# Patient Record
Sex: Female | Born: 1965 | Race: Black or African American | Hispanic: No | Marital: Single | Smoking: Never smoker
Health system: Southern US, Community
[De-identification: ages and names within clinical notes are randomized; demographics above are authoritative.]

## PROBLEM LIST (undated history)

## (undated) DIAGNOSIS — I10 Essential (primary) hypertension: Secondary | ICD-10-CM

---

## 2019-06-13 ENCOUNTER — Encounter (HOSPITAL_COMMUNITY): Payer: Self-pay | Admitting: Emergency Medicine

## 2019-06-13 ENCOUNTER — Ambulatory Visit (HOSPITAL_COMMUNITY)
Admission: EM | Admit: 2019-06-13 | Discharge: 2019-06-13 | Disposition: A | Discharge status: Home / Self Care | Payer: Self-pay | Attending: Family Medicine | Admitting: Family Medicine

## 2019-06-13 ENCOUNTER — Other Ambulatory Visit: Payer: Self-pay

## 2019-06-13 DIAGNOSIS — I10 Essential (primary) hypertension: Secondary | ICD-10-CM

## 2019-06-13 HISTORY — DX: Essential (primary) hypertension: I10

## 2019-06-13 MED ORDER — CLONIDINE HCL 0.1 MG PO TABS
0.1000 mg | ORAL_TABLET | Freq: Once | ORAL | Status: AC
  Administered 2019-06-13: 0.1 mg via ORAL

## 2019-06-13 MED ORDER — CLONIDINE HCL 0.1 MG PO TABS: 0.1000 mg | ORAL_TABLET | Freq: Once | ORAL | Status: DC

## 2019-06-13 MED ORDER — AMLODIPINE BESYLATE 5 MG PO TABS: 5.0000 mg | ORAL_TABLET | Freq: Every day | ORAL | 1 refills | Status: DC

## 2019-06-13 MED ORDER — CLONIDINE HCL 0.1 MG PO TABS
ORAL_TABLET | ORAL | Status: AC
  Filled 2019-06-13: qty 1

## 2019-06-13 MED ORDER — CLONIDINE HCL 0.1 MG PO TABS
0.1000 mg | ORAL_TABLET | Freq: Once | ORAL | Status: AC
  Administered 2019-06-13: 11:00:00 0.1 mg via ORAL

## 2019-06-13 NOTE — ED Provider Notes (Signed)
[facility]    CSN: [id number] Arrival date & time: 06/13/19  1028      History   Chief Complaint Chief Complaint  Patient presents with  . Medication Refill  . Hypertension    HPI Amanda Dunn is a 53 y.o. female.   Patient has history of hypertension.  Moved here about 3 months ago and has been out of her medications since that time.  Has not had insurance so has not seen Dr. to refill medicine.  Had formally taken amlodipine with good results.  She denies any chest pain headache dizziness or other symptoms that might suggest malignant hypertension.  HPI  Past Medical History:  Diagnosis Date  . Hypertension     There are no active problems to display for this patient.   History reviewed. No pertinent surgical history.  OB History   No obstetric history on file.      Home Medications    Prior to Admission medications   Medication Sig Start Date End Date Taking? Authorizing Provider  amLODipine (NORVASC) 5 MG tablet Take 5 mg by mouth daily.   Yes [provider]    Family History Family History  Problem Relation Age of Onset  . Hypertension Mother   . Heart failure Mother   . Hypertension Father   . Heart failure Father     Social History Social History   Tobacco Use  . Smoking status: Never Smoker  . Smokeless tobacco: Never Used  Substance Use Topics  . Alcohol use: Not Currently  . Drug use: Never     Allergies   Patient has no known allergies.   Review of Systems Review of Systems  All other systems reviewed and are negative.    Physical Exam Triage Vital Signs ED Triage Vitals [06/13/19 1107]  Enc Vitals Group     BP (!) 211/110     Pulse Rate 78     Resp 18     Temp 98.2 F (36.8 C)     Temp Source Oral     SpO2 100 %     Weight      Height      Head Circumference      Peak Flow      Pain Score 0     Pain Loc      Pain Edu?      Excl. in GC?    No data found.  Updated Vital Signs BP (!)  211/110 (BP Location: Left Arm)   Pulse 78   Temp 98.2 F (36.8 C) (Oral)   Resp 18   SpO2 100%   Visual Acuity Right Eye Distance:   Left Eye Distance:   Bilateral Distance:    Right Eye Near:   Left Eye Near:    Bilateral Near:     Physical Exam Nursing note reviewed.  Constitutional:      Appearance: Normal appearance. She is obese.  HENT:     Head: Normocephalic.  Cardiovascular:     Rate and Rhythm: Normal rate and regular rhythm.  Pulmonary:     Breath sounds: Normal breath sounds.  Neurological:     General: No focal deficit present.     Mental Status: She is oriented to person, place, and time.   Pressure after clonidine decreased to 201/90.  We will continue observation   UC Treatments / Results  Labs (all labs ordered are listed, but only abnormal results are displayed) Labs Reviewed - No data to  display  EKG   Radiology No results found.  Procedures Procedures (including critical care time)  Medications Ordered in UC Medications - No data to display  Initial Impression / Assessment and Plan / UC Course  I have reviewed the triage vital signs and the nursing notes.  Pertinent labs & imaging results that were available during my care of the patient were reviewed by me and considered in my medical decision making (see chart for details).     Hypertension, poorly controlled.  Gave patient clonidine 0.1 mg x 2 in the facility.  Last pressure down to 191/91.  Will continue amlodipine with instructions to begin at 5 mg at bedtime if after 2 to 3 days pressure has not responded increased to 5 mg twice daily and follow-up with primary care physician Final Clinical Impressions(s) / UC Diagnoses   Final diagnoses:  None   Discharge Instructions   None    ED Prescriptions    None     PDMP not reviewed this encounter.   Wardell Honour, MD 06/13/19 1245

## 2019-06-13 NOTE — Discharge Instructions (Addendum)
Begin amlodipine 5 mg at bedtime.  If after 3 days pressure has not normalized increase to 5 mg in a.m. and 5 mg at bedtime and follow-up with primary care physician as soon as you can

## 2019-06-13 NOTE — ED Triage Notes (Signed)
Pt sts recently moved here and hasnt had BP meds x 1 month; pt requesting refill until her insurance with her new employer starts next month

## 2019-08-13 ENCOUNTER — Encounter: Payer: Self-pay | Admitting: Family Medicine

## 2019-08-13 ENCOUNTER — Other Ambulatory Visit: Payer: Self-pay

## 2019-08-13 ENCOUNTER — Telehealth: Payer: Self-pay

## 2019-08-13 ENCOUNTER — Ambulatory Visit (INDEPENDENT_AMBULATORY_CARE_PROVIDER_SITE_OTHER): Payer: Self-pay | Admitting: Family Medicine

## 2019-08-13 VITALS — BP 180/60 | HR 71 | Ht 62.0 in | Wt 170.0 lb

## 2019-08-13 DIAGNOSIS — M79672 Pain in left foot: Secondary | ICD-10-CM

## 2019-08-13 DIAGNOSIS — M79671 Pain in right foot: Secondary | ICD-10-CM

## 2019-08-13 DIAGNOSIS — Z8249 Family history of ischemic heart disease and other diseases of the circulatory system: Secondary | ICD-10-CM

## 2019-08-13 DIAGNOSIS — M7750 Other enthesopathy of unspecified foot: Secondary | ICD-10-CM

## 2019-08-13 DIAGNOSIS — Z8679 Personal history of other diseases of the circulatory system: Secondary | ICD-10-CM

## 2019-08-13 DIAGNOSIS — I10 Essential (primary) hypertension: Secondary | ICD-10-CM

## 2019-08-13 MED ORDER — AMLODIPINE BESYLATE 10 MG PO TABS: 10.0000 mg | ORAL_TABLET | Freq: Every day | ORAL | 3 refills | Status: DC

## 2019-08-13 NOTE — Telephone Encounter (Signed)
Spoke with pt. Informed her of her ECHO at [facility] at 9:00. Pt understood. Salvatore Marvel, CMA

## 2019-08-13 NOTE — Patient Instructions (Addendum)
Thank you for allowing me to take part in your care today!  I will increase your amlodipine from 5 mg to 10 mg daily. Please plan to follow-up with me in 2 weeks in order to recheck your blood pressure and further discuss medications.  Today I will be doing some blood work to check your cholesterol, organ function, blood levels, and uric acid as well as thyroid levels. I will call you with any abnormal values.  We will also work on getting medical records from your previous physician.  Please plan to see me in 2 weeks!    Managing Your Hypertension Hypertension is commonly called high blood pressure. This is when the force of your blood pressing against the walls of your arteries is too strong. Arteries are blood vessels that carry blood from your heart throughout your body. Hypertension forces the heart to work harder to pump blood, and may cause the arteries to become narrow or stiff. Having untreated or uncontrolled hypertension can cause heart attack, stroke, kidney disease, and other problems. What are blood pressure readings? A blood pressure reading consists of a higher number over a lower number. Ideally, your blood pressure should be below 120/80. The first ("top") number is called the systolic pressure. It is a measure of the pressure in your arteries as your heart beats. The second ("bottom") number is called the diastolic pressure. It is a measure of the pressure in your arteries as the heart relaxes. What does my blood pressure reading mean? Blood pressure is classified into four stages. Based on your blood pressure reading, your health care provider may use the following stages to determine what type of treatment you need, if any. Systolic pressure and diastolic pressure are measured in a unit called mm Hg. Normal  Systolic pressure: below 120.  Diastolic pressure: below 80. Elevated  Systolic pressure: 120-129.  Diastolic pressure: below 80. Hypertension stage  1  Systolic pressure: 130-139.  Diastolic pressure: 80-89. Hypertension stage 2  Systolic pressure: 140 or above.  Diastolic pressure: 90 or above. What health risks are associated with hypertension? Managing your hypertension is an important responsibility. Uncontrolled hypertension can lead to:  A heart attack.  A stroke.  A weakened blood vessel (aneurysm).  Heart failure.  Kidney damage.  Eye damage.  Metabolic syndrome.  Memory and concentration problems. What changes can I make to manage my hypertension? Hypertension can be managed by making lifestyle changes and possibly by taking medicines. Your health care provider will help you make a plan to bring your blood pressure within a normal range. Eating and drinking   Eat a diet that is high in fiber and potassium, and low in salt (sodium), added sugar, and fat. An example eating plan is called the DASH (Dietary Approaches to Stop Hypertension) diet. To eat this way: ? Eat plenty of fresh fruits and vegetables. Try to fill half of your plate at each meal with fruits and vegetables. ? Eat whole grains, such as whole wheat pasta, brown rice, or whole grain bread. Fill about one quarter of your plate with whole grains. ? Eat low-fat diary products. ? Avoid fatty cuts of meat, processed or cured meats, and poultry with skin. Fill about one quarter of your plate with lean proteins such as fish, chicken without skin, beans, eggs, and tofu. ? Avoid premade and processed foods. These tend to be higher in sodium, added sugar, and fat.  Reduce your daily sodium intake. Most people with hypertension should eat less than  1,500 mg of sodium a day.  Limit alcohol intake to no more than 1 drink a day for nonpregnant women and 2 drinks a day for men. One drink equals 12 oz of beer, 5 oz of wine, or 1 oz of hard liquor. Lifestyle  Work with your health care provider to maintain a healthy body weight, or to lose weight. Ask what an  ideal weight is for you.  Get at least 30 minutes of exercise that causes your heart to beat faster (aerobic exercise) most days of the week. Activities may include walking, swimming, or biking.  Include exercise to strengthen your muscles (resistance exercise), such as weight lifting, as part of your weekly exercise routine. Try to do these types of exercises for 30 minutes at least 3 days a week.  Do not use any products that contain nicotine or tobacco, such as cigarettes and e-cigarettes. If you need help quitting, ask your health care provider.  Control any long-term (chronic) conditions you have, such as high cholesterol or diabetes. Monitoring  Monitor your blood pressure at home as told by your health care provider. Your personal target blood pressure may vary depending on your medical conditions, your age, and other factors.  Have your blood pressure checked regularly, as often as told by your health care provider. Working with your health care provider  Review all the medicines you take with your health care provider because there may be side effects or interactions.  Talk with your health care provider about your diet, exercise habits, and other lifestyle factors that may be contributing to hypertension.  Visit your health care provider regularly. Your health care provider can help you create and adjust your plan for managing hypertension. Will I need medicine to control my blood pressure? Your health care provider may prescribe medicine if lifestyle changes are not enough to get your blood pressure under control, and if:  Your systolic blood pressure is 130 or higher.  Your diastolic blood pressure is 80 or higher. Take medicines only as told by your health care provider. Follow the directions carefully. Blood pressure medicines must be taken as prescribed. The medicine does not work as well when you skip doses. Skipping doses also puts you at risk for problems. Contact a  health care provider if:  You think you are having a reaction to medicines you have taken.  You have repeated (recurrent) headaches.  You feel dizzy.  You have swelling in your ankles.  You have trouble with your vision. Get help right away if:  You develop a severe headache or confusion.  You have unusual weakness or numbness, or you feel faint.  You have severe pain in your chest or abdomen.  You vomit repeatedly.  You have trouble breathing. Summary  Hypertension is when the force of blood pumping through your arteries is too strong. If this condition is not controlled, it may put you at risk for serious complications.  Your personal target blood pressure may vary depending on your medical conditions, your age, and other factors. For most people, a normal blood pressure is less than 120/80.  Hypertension is managed by lifestyle changes, medicines, or both. Lifestyle changes include weight loss, eating a healthy, low-sodium diet, exercising more, and limiting alcohol. This information is not intended to replace advice given to you by your health care provider. Make sure you discuss any questions you have with your health care provider. Document Released: 05/21/2012 Document Revised: 12/19/2018 Document Reviewed: 07/25/2016 Elsevier Patient Education  2020 [employer]    Ankle Exercises Ask your health care provider which exercises are safe for you. Do exercises exactly as told by your health care provider and adjust them as directed. It is normal to feel mild stretching, pulling, tightness, or mild discomfort as you do these exercises. Stop right away if you feel sudden pain or your pain gets worse. Do not begin these exercises until told by your health care provider. Stretching and range-of-motion exercises These exercises warm up your muscles and joints and improve the movement and flexibility of your ankle. These exercises may also help to relieve  pain. Dorsiflexion/plantar flexion  1. Sit with your __________ knee straight or bent. Do not rest your foot on anything. 2. Flex your __________ ankle to tilt the top of your foot toward your shin. This is called dorsiflexion. 3. Hold this position for __________ seconds. 4. Point your toes downward to tilt the top of your foot away from your shin. This is called plantar flexion. 5. Hold this position for __________ seconds. Repeat __________ times. Complete this exercise __________ times a day. Ankle alphabet  1. Sit with your __________ foot supported at your lower leg. ? Do not rest your foot on anything. ? Make sure your foot has room to move freely. 2. Think of your __________ foot as a paintbrush: ? Move your foot to trace each letter of the alphabet in the air. Keep your hip and knee still while you trace the letters. Trace every letter from A to Z. ? Make the letters as large as you can without causing or increasing any discomfort. Repeat __________ times. Complete this exercise __________ times a day. Passive ankle dorsiflexion This is an exercise in which something or someone moves your ankle for you. You do not move it yourself. 1. Sit on a chair that is placed on a non-carpeted surface. 2. Place your __________ foot on the floor, directly under your __________ knee. Extend your __________ leg for support. 3. Keeping your heel down, slide your __________ foot back toward the chair until you feel a stretch at your ankle or calf. If you do not feel a stretch, slide your buttocks forward to the edge of the chair while keeping your heel down. 4. Hold this stretch for __________ seconds. Repeat __________ times. Complete this exercise __________ times a day. Strengthening exercises These exercises build strength and endurance in your ankle. Endurance is the ability to use your muscles for a long time, even after they get tired. Dorsiflexors These are muscles that lift your foot  up. 1. Secure a rubber exercise band or tube to an object, such as a table leg, that will stay still when the band is pulled. Secure the other end around your __________ foot. 2. Sit on the floor, facing the object with your __________ leg extended. The band or tube should be slightly tense when your foot is relaxed. 3. Slowly flex your __________ ankle and toes to bring your foot toward your shin. 4. Hold this position for __________ seconds. 5. Slowly return your foot to the starting position, controlling the band as you do that. Repeat __________ times. Complete this exercise __________ times a day. Plantar flexors These are muscles that push your foot down. 1. Sit on the floor with your __________ leg extended. 2. Loop a rubber exercise band or tube around the ball of your __________ foot. The ball of your foot is on the walking surface, right under your toes. The band or  tube should be slightly tense when your foot is relaxed. 3. Slowly point your toes downward, pushing them away from you. 4. Hold this position for __________ seconds. 5. Slowly release the tension in the band or tube, controlling smoothly until your foot is back in the starting position. Repeat __________ times. Complete this exercise __________ times a day. Towel curls  1. Sit in a chair on a non-carpeted surface, and put your feet on the floor. 2. Place a towel in front of your feet. If told by your health care provider, add a __________ pound weight to the end of the towel. 3. Keeping your heel on the floor, put your __________ foot on the towel. 4. Pull the towel toward you by grabbing the towel with your toes and curling them under. Keep your heel on the floor. 5. Let your toes relax. 6. Grab the towel again. Keep pulling the towel until it is completely underneath your foot. Repeat __________ times. Complete this exercise __________ times a day. Standing plantar flexion This is an exercise in which you use your  toes to lift your body's weight while standing. 1. Stand with your feet shoulder-width apart. 2. Keep your weight spread evenly over the width of your feet while you rise up on your toes. Use a wall or table to steady yourself if needed, but try not to use it for support. 3. If this exercise is too easy, try these options: ? Shift your weight toward your __________ leg until you feel challenged. ? If told by your health care provider, lift your uninjured leg off the floor. 4. Hold this position for __________ seconds. Repeat __________ times. Complete this exercise __________ times a day. Tandem walking 1. Stand with one foot directly in front of the other. 2. Slowly raise your back foot up, lifting your heel before your toes, and place it directly in front of your other foot. 3. Continue to walk in this heel-to-toe way for __________ or for as long as told by your health care provider. Have a countertop or wall nearby to use if needed to keep your balance, but try not to hold onto anything for support. Repeat __________ times. Complete this exercise __________ times a day. This information is not intended to replace advice given to you by your health care provider. Make sure you discuss any questions you have with your health care provider. Document Released: 07/11/2005 Document Revised: 05/24/2018 Document Reviewed: 05/26/2018 Elsevier Patient Education  2020 [employer].

## 2019-08-13 NOTE — Progress Notes (Signed)
Patient Name: Amanda Dunn Date of Birth: 04-Jun-1966 Date of Visit: 08/16/19 PCP: Stark Klein, MD  Chief Complaint: Pain on the back of the legs   Subjective: Amanda Dunn is a 53 y.o. with medical history significant for hypertension presenting today for pain on the back of her legs near her Achilles tendon bilaterally.   Lower leg/ankle pain Amanda Dunn states she has been experiencing pain in the area near her Achilles tendon bilaterally.  Patient reports applying ice packs and taken ibuprofen in order to assist with the pain.  She reports that wearing high heeled shoes seems to decrease the pain.  She has this pain daily and it has been occurring for years but recently worsened in the last few weeks.  Patient reports previously having x-rays that show that she had bone spurs but has not received any injections or surgical evaluation for this.  Patient request to be seen by podiatrist.  Hypertension Patient reports history of hypertension has been on amlodipine 5 mg.  Patient is a resident of [place] as of 2 years ago.  She reports having elevated blood pressure measurements up to the 790W systolic over 409 diastolic but reports feeling fine.  She was evaluated at the urgent care for this elevated blood pressure measurement that was found incidentally.  On review of system patient denies chest pain, shortness of breath, headaches, blurry vision or any history of abnormal ophthalmologic findings.  Amanda Dunn reports that she limits fried foods, does not eat eggs, walks as much as she can stand due to her leg pain, does not drink sodas, does not eat sugary high-fat foods.  She reports taking a multivitamin in addition to her hypertension medication.  She is on no other medication.  Patient also reports a past medical history of rheumatic fever for which she has been monitored for since she was a teenager.  She is no longer on any prophylactic antibiotics for the history of rheumatic  fever.  Patient denies use of tobacco.  Patient reports drinking 1-2 mixed drinks per month.  Patient denies any recreational drug use.  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 and lives alone.  She reports moving from [place] 2 and half years ago. Family history is significant for fatal MI in both parents at ages 41 and 59.   I have reviewed the patient's medical, surgical, family, and social history as appropriate.  Vitals:   08/13/19 1005  BP: (!) 180/60  Pulse: 71  SpO2: 99%    Physical Exam:   General: Alert and cooperative and appears to be in no acute distress HEENT: Neck non-tender without lymphadenopathy, masses or thyromegaly Cardio: Normal S1 and S2, no S3 or S4. Rhythm is regular. No murmurs or rubs.   Pulm: Clear to auscultation bilaterally, no crackles, wheezing, or diminished breath sounds. Normal respiratory effort Abdomen: Bowel sounds normal. Abdomen soft and non-tender.  Extremities: No peripheral edema. Warm/ well perfused.  Strong radial and pedal pulses. Swelling noted bilaterally at inferior portion of achilles tendons and superior aspect of calcaneal bones bilaterally. No erythema or point tenderness. Patient is wearing high heeled boots during appointment and walks as if she is in pain but otherwise has normal ROM at the ankle joint with flexion,extension and rotation as well as eversion and inversion.  Neuro: alert and oriented, no decreased sensation at ankle joint or in feet bilaterally   Assessment & Plan:   HTN, goal below 140/90 Blood pressure is elevated 180/60 in  office today.  -advised patient to monitor blood pressures at home.  -will follow up in 2 weeks  -increased home amlodipine to 10mg  from 5mg   -lipid panel collected, total 208, LDL 132  Patient reports both parents passed away due to MI at 60 and 69 years old  -CBC, CMP, TSH collected   History of rheumatic fever Patient reports history of rheumatic fever for which she took  antibiotics for decades for prophylaxis. Patient states that she is no longer on medication for this.  -no murmur appreciated on exam today  -scheduled for echocardiogram   Ankle bone spur, bilateraly  Patient presents with bilateral pain near ankles with history of bone spurs on x rays in the past. Patient has been taking ibuprofen and applying ice packs and wearing high heeled shoes for relief.  -referral placed for podiatry  -patient encouraged to continue conservative therapy until then  -demonstrated targeted stretches  Return to care in 2 weeks.   38, MD  Family Medicine  PGY1

## 2019-08-14 LAB — LIPID PANEL
Chol/HDL Ratio: 3.3 ratio (ref 0.0–4.4)
Cholesterol, Total: 208 mg/dL — ABNORMAL HIGH (ref 100–199)
HDL: 63 mg/dL (ref >39)
LDL Chol Calc (NIH): 132 mg/dL — ABNORMAL HIGH (ref 0–99)
Triglycerides: 72 mg/dL (ref 0–149)
VLDL Cholesterol Cal: 13 mg/dL (ref 5–40)

## 2019-08-14 LAB — CBC
Hematocrit: 36.6 % (ref 34.0–46.6)
Hemoglobin: 12.1 g/dL (ref 11.1–15.9)
MCH: 25.1 pg — ABNORMAL LOW (ref 26.6–33.0)
MCHC: 33.1 g/dL (ref 31.5–35.7)
MCV: 76 fL — ABNORMAL LOW (ref 79–97)
Platelets: 321 10*3/uL (ref 150–450)
RBC: 4.82 x10E6/uL (ref 3.77–5.28)
RDW: 13.5 % (ref 11.7–15.4)
WBC: 4.8 10*3/uL (ref 3.4–10.8)

## 2019-08-14 LAB — COMPREHENSIVE METABOLIC PANEL
ALT: 18 IU/L (ref 0–32)
AST: 20 IU/L (ref 0–40)
Albumin/Globulin Ratio: 1.1 — ABNORMAL LOW (ref 1.2–2.2)
Albumin: 4.2 g/dL (ref 3.8–4.9)
Alkaline Phosphatase: 90 IU/L (ref 39–117)
BUN/Creatinine Ratio: 20 (ref 9–23)
BUN: 15 mg/dL (ref 6–24)
Bilirubin Total: 0.3 mg/dL (ref 0.0–1.2)
CO2: 22 mmol/L (ref 20–29)
Calcium: 9.1 mg/dL (ref 8.7–10.2)
Chloride: 101 mmol/L (ref 96–106)
Creatinine, Ser: 0.74 mg/dL (ref 0.57–1.00)
GFR calc Af Amer: 105 mL/min/{1.73_m2} (ref >59)
GFR calc non Af Amer: 91 mL/min/{1.73_m2} (ref >59)
Globulin, Total: 3.7 g/dL (ref 1.5–4.5)
Glucose: 96 mg/dL (ref 65–99)
Potassium: 4.4 mmol/L (ref 3.5–5.2)
Sodium: 137 mmol/L (ref 134–144)
Total Protein: 7.9 g/dL (ref 6.0–8.5)

## 2019-08-14 LAB — TSH: TSH: 1.72 u[IU]/mL (ref 0.450–4.500)

## 2019-08-14 NOTE — Progress Notes (Signed)
Results reviewed and discussed with patient to limit amount of cheese in her diet. Patient verbalized understanding. Phone disconnected during call and unable to reach patient when called back. Will plan to discuss ASCVD risk with patient strongly consider starting Atorvastatin 10 or 20mg  at next visit given strong family history of MI.

## 2019-08-16 ENCOUNTER — Encounter: Payer: Self-pay | Admitting: Family Medicine

## 2019-08-16 DIAGNOSIS — M7750 Other enthesopathy of unspecified foot: Secondary | ICD-10-CM | POA: Insufficient documentation

## 2019-08-16 DIAGNOSIS — Z8679 Personal history of other diseases of the circulatory system: Secondary | ICD-10-CM | POA: Insufficient documentation

## 2019-08-16 DIAGNOSIS — I10 Essential (primary) hypertension: Secondary | ICD-10-CM | POA: Insufficient documentation

## 2019-08-16 NOTE — Assessment & Plan Note (Signed)
Patient presents with bilateral pain near ankles with history of bone spurs on x rays in the past. Patient has been taking ibuprofen and applying ice packs and wearing high heeled shoes for relief.  -referral placed for podiatry  -patient encouraged to continue conservative therapy until then  -demonstrated targeted stretches

## 2019-08-16 NOTE — Assessment & Plan Note (Addendum)
Blood pressure is elevated 180/60 in office today.  -advised patient to monitor blood pressures at home.  -will follow up in 2 weeks  -increased home amlodipine to 10mg  from 5mg   -lipid panel collected, total 208, LDL 132  Patient reports both parents passed away due to MI at 15 and 55 years old  -CBC, CMP, TSH collected

## 2019-08-16 NOTE — Assessment & Plan Note (Signed)
Patient reports history of rheumatic fever for which she took antibiotics for decades for prophylaxis. Patient states that she is no longer on medication for this.  -no murmur appreciated on exam today  -scheduled for echocardiogram

## 2019-08-20 ENCOUNTER — Ambulatory Visit (HOSPITAL_COMMUNITY)
Admission: RE | Admit: 2019-08-20 | Discharge: 2019-08-20 | Disposition: A | Discharge status: Home / Self Care | Payer: No Typology Code available for payment source | Source: Ambulatory Visit | Attending: Family Medicine | Admitting: Family Medicine

## 2019-08-20 ENCOUNTER — Other Ambulatory Visit: Payer: Self-pay

## 2019-08-20 DIAGNOSIS — Z8249 Family history of ischemic heart disease and other diseases of the circulatory system: Secondary | ICD-10-CM | POA: Diagnosis not present

## 2019-08-20 DIAGNOSIS — Z8679 Personal history of other diseases of the circulatory system: Secondary | ICD-10-CM

## 2019-08-20 DIAGNOSIS — I10 Essential (primary) hypertension: Secondary | ICD-10-CM

## 2019-08-20 NOTE — Progress Notes (Signed)
  Echocardiogram 2D Echocardiogram has been performed.  Amanda Dunn 08/20/2019, 9:39 AM

## 2019-08-21 ENCOUNTER — Ambulatory Visit (INDEPENDENT_AMBULATORY_CARE_PROVIDER_SITE_OTHER): Payer: No Typology Code available for payment source | Admitting: Family Medicine

## 2019-08-21 ENCOUNTER — Other Ambulatory Visit (HOSPITAL_COMMUNITY)
Admission: RE | Admit: 2019-08-21 | Discharge: 2019-08-21 | Disposition: A | Discharge status: Home / Self Care | Payer: No Typology Code available for payment source | Source: Ambulatory Visit | Attending: Family Medicine | Admitting: Family Medicine

## 2019-08-21 ENCOUNTER — Encounter: Payer: Self-pay | Admitting: Family Medicine

## 2019-08-21 VITALS — BP 140/68 | HR 85 | Ht 62.0 in | Wt 169.0 lb

## 2019-08-21 DIAGNOSIS — E785 Hyperlipidemia, unspecified: Secondary | ICD-10-CM

## 2019-08-21 DIAGNOSIS — Z124 Encounter for screening for malignant neoplasm of cervix: Secondary | ICD-10-CM | POA: Diagnosis present

## 2019-08-21 DIAGNOSIS — Z23 Encounter for immunization: Secondary | ICD-10-CM | POA: Diagnosis not present

## 2019-08-21 DIAGNOSIS — Z0000 Encounter for general adult medical examination without abnormal findings: Secondary | ICD-10-CM

## 2019-08-21 DIAGNOSIS — Z1211 Encounter for screening for malignant neoplasm of colon: Secondary | ICD-10-CM | POA: Diagnosis not present

## 2019-08-21 DIAGNOSIS — Z1231 Encounter for screening mammogram for malignant neoplasm of breast: Secondary | ICD-10-CM

## 2019-08-21 MED ORDER — ROSUVASTATIN CALCIUM 10 MG PO TABS: 10.0000 mg | ORAL_TABLET | Freq: Every day | ORAL | 3 refills | Status: DC

## 2019-08-21 NOTE — Patient Instructions (Addendum)
Please call the [facility] to Schedule your mammogram.  Address: [address], [place], [place] [address] Phone: [phone]  We have referred you to gastroenterology for your colonoscopy. The office will call you with the details.   We have given you your tetanus shot.   We started you on a cholesterol medication called rosuvastatin.     Health Maintenance, Female Adopting a healthy lifestyle and getting preventive care are important in promoting health and wellness. Ask your health care provider about:  The right schedule for you to have regular tests and exams.  Things you can do on your own to prevent diseases and keep yourself healthy. What should I know about diet, weight, and exercise? Eat a healthy diet   Eat a diet that includes plenty of vegetables, fruits, low-fat dairy products, and lean protein.  Do not eat a lot of foods that are high in solid fats, added sugars, or sodium. Maintain a healthy weight Body mass index (BMI) is used to identify weight problems. It estimates body fat based on height and weight. Your health care provider can help determine your BMI and help you achieve or maintain a healthy weight. Get regular exercise Get regular exercise. This is one of the most important things you can do for your health. Most adults should:  Exercise for at least 150 minutes each week. The exercise should increase your heart rate and make you sweat (moderate-intensity exercise).  Do strengthening exercises at least twice a week. This is in addition to the moderate-intensity exercise.  Spend less time sitting. Even light physical activity can be beneficial. Watch cholesterol and blood lipids Have your blood tested for lipids and cholesterol at 55 years of age, then have this test every 5 years. Have your cholesterol levels checked more often if:  Your lipid or cholesterol levels are high.  You are older than 55 years of age.  You are  at high risk for heart disease. What should I know about cancer screening? Depending on your health history and family history, you may need to have cancer screening at various ages. This may include screening for:  Breast cancer.  Cervical cancer.  Colorectal cancer.  Skin cancer.  Lung cancer. What should I know about heart disease, diabetes, and high blood pressure? Blood pressure and heart disease  High blood pressure causes heart disease and increases the risk of stroke. This is more likely to develop in people who have high blood pressure readings, are of African descent, or are overweight.  Have your blood pressure checked: ? Every 3-5 years if you are 26-102 years of age. ? Every year if you are 24 years old or older. Diabetes Have regular diabetes screenings. This checks your fasting blood sugar level. Have the screening done:  Once every three years after age 28 if you are at a normal weight and have a low risk for diabetes.  More often and at a younger age if you are overweight or have a high risk for diabetes. What should I know about preventing infection? Hepatitis B If you have a higher risk for hepatitis B, you should be screened for this virus. Talk with your health care provider to find out if you are at risk for hepatitis B infection. Hepatitis C Testing is recommended for:  Everyone born from [birth year] through 1967.  Anyone with known risk factors for hepatitis C. Sexually transmitted infections (STIs)  Get screened for STIs, including gonorrhea and chlamydia, if: ?  You are sexually active and are younger than 55 years of age. ? You are older than 55 years of age and your health care provider tells you that you are at risk for this type of infection. ? Your sexual activity has changed since you were last screened, and you are at increased risk for chlamydia or gonorrhea. Ask your health care provider if you are at risk.  Ask your health care provider about  whether you are at high risk for HIV. Your health care provider may recommend a prescription medicine to help prevent HIV infection. If you choose to take medicine to prevent HIV, you should first get tested for HIV. You should then be tested every 3 months for as long as you are taking the medicine. Pregnancy  If you are about to stop having your period (premenopausal) and you may become pregnant, seek counseling before you get pregnant.  Take 400 to 800 micrograms (mcg) of folic acid every day if you become pregnant.  Ask for birth control (contraception) if you want to prevent pregnancy. Osteoporosis and menopause Osteoporosis is a disease in which the bones lose minerals and strength with aging. This can result in bone fractures. If you are 44 years old or older, or if you are at risk for osteoporosis and fractures, ask your health care provider if you should:  Be screened for bone loss.  Take a calcium or vitamin D supplement to lower your risk of fractures.  Be given hormone replacement therapy (HRT) to treat symptoms of menopause. Follow these instructions at home: Lifestyle  Do not use any products that contain nicotine or tobacco, such as cigarettes, e-cigarettes, and chewing tobacco. If you need help quitting, ask your health care provider.  Do not use street drugs.  Do not share needles.  Ask your health care provider for help if you need support or information about quitting drugs. Alcohol use  Do not drink alcohol if: ? Your health care provider tells you not to drink. ? You are pregnant, may be pregnant, or are planning to become pregnant.  If you drink alcohol: ? Limit how much you use to 0-1 drink a day. ? Limit intake if you are breastfeeding.  Be aware of how much alcohol is in your drink. In the [place] one drink equals one 12 oz bottle of beer (355 mL), one 5 oz glass of wine (148 mL), or one 1 oz glass of hard liquor (44 mL). General instructions  Schedule  regular health, dental, and eye exams.  Stay current with your vaccines.  Tell your health care provider if: ? You often feel depressed. ? You have ever been abused or do not feel safe at home. Summary  Adopting a healthy lifestyle and getting preventive care are important in promoting health and wellness.  Follow your health care provider's instructions about healthy diet, exercising, and getting tested or screened for diseases.  Follow your health care provider's instructions on monitoring your cholesterol and blood pressure. This information is not intended to replace advice given to you by your health care provider. Make sure you discuss any questions you have with your health care provider. Document Released: 03/12/2011 Document Revised: 08/20/2018 Document Reviewed: 08/20/2018 Elsevier Patient Education  2020 [employer].

## 2019-08-21 NOTE — Progress Notes (Signed)
dap

## 2019-08-21 NOTE — Progress Notes (Signed)
Subjective:  Amanda Dunn is a 53 y.o. female who presents to the [facility] today with a chief complaint of annual physical.   HPI:  53 y.o. year old female presents for well woman/preventative visit and annual GYN examination.  Acute Concerns: Patient with a significant cardiac history. Mother and Father died of MI in 53s. Patient with HLD and ASCVD risk of 14%. Patient with a h/o rheumatic heart dz.  Echocardiogram done yesterday. Report shows EF 75%, Moderately increased left ventricular posterior wall thickness. NO noted valvular disease. Discussed findings with patient. Advised patient on initiating a statin. She is receptive.   Patient had recent HIV and Hep C screening. Reports negative.   Exercise: intermittent   Birth Control: BLT many years ago  Allergies: No Known Allergies  Social:  Social History   Socioeconomic History  . Marital status: Single    Spouse name: Not on file  . Number of children: Not on file  . Years of education: Not on file  . Highest education level: Not on file  Occupational History  . Not on file  Tobacco Use  . Smoking status: Never Smoker  . Smokeless tobacco: Never Used  Substance and Sexual Activity  . Alcohol use: Not Currently  . Drug use: Never  . Sexual activity: Not on file  Other Topics Concern  . Not on file  Social History Narrative  . Not on file   Social Determinants of Health   Financial Resource Strain:   . Difficulty of Paying Living Expenses: Not on file  Food Insecurity:   . Worried About Charity fundraiser in the Last Year: Not on file  . Ran Out of Food in the Last Year: Not on file  Transportation Needs:   . Lack of Transportation (Medical): Not on file  . Lack of Transportation (Non-Medical): Not on file  Physical Activity:   . Days of Exercise per Week: Not on file  . Minutes of Exercise per Session: Not on file  Stress:   . Feeling of Stress : Not on file  Social Connections:   . Frequency of  Communication with Friends and Family: Not on file  . Frequency of Social Gatherings with Friends and Family: Not on file  . Attends Religious Services: Not on file  . Active Member of Clubs or Organizations: Not on file  . Attends Archivist Meetings: Not on file  . Marital Status: Not on file    Immunization: Needs TDaP  Cancer Screening:  Pap Smear: Due today  Mammogram: Last approximately 3 years ago per patient  Colonoscopy: Never had one  Vitals:   08/21/19 0920  BP: 140/68  Pulse: 85  SpO2: 98%    Physical Exam: VITALS: Reviewed GEN: Pleasant female, NAD CARDIAC:RRR, S1 and S2 present, no murmur, no heaves/thrills RESP: CTAB, normal effort ABD: Soft, no tenderness, normal bowel sounds GU/GYN:Exam performed in the presence of a chaperone. Normal external genitalia. Cervix unremarkable.  EXT: No edema, 2+ radial and DP pulses SKIN: Warm and dry, no rash  ASSESSMENT & PLAN: 53 y.o. female presents for annual well woman/preventative exam and GYN exam. Please see problem specific assessment and plan.   H/o of Rheumatic Heart Dz. No valvular abnormalities on recent echo. Patient requesting work accommodations due to [employer]. PCP, Dr. Rosita Fire, recently wrote a letter for this, but work has specific form needed. Since PCP has been working on this issue with patient. Will place form in her box and  send electronic message to her to inform her of the needed form.   Cervical Cancer Screening. Pap Smear performed today.   Breast Cancer Screening. Ordering Mammogram today. Patient given information to schedule it when convenient.   Tetanus prevention. Tdap given today.   Records request for HIV and Hep C results  Colon cancer screening. Referral to GI for colonoscopy  HLD. ASCVD Score 14% ten year risk. Starting Crestor 10 mg today.   HTN. BP at goal. Currently on amlodipine 10 mg.   Patient follow up in 6 months for BP check.     Thomes Dinning, MD,  [facility] RESIDENT - PGY3 08/21/2019 10:35 AM

## 2019-08-24 ENCOUNTER — Other Ambulatory Visit: Payer: Self-pay | Admitting: Podiatry

## 2019-08-24 ENCOUNTER — Encounter: Payer: Self-pay | Admitting: Gastroenterology

## 2019-08-24 ENCOUNTER — Telehealth: Payer: Self-pay | Admitting: Podiatry

## 2019-08-24 ENCOUNTER — Ambulatory Visit: Payer: No Typology Code available for payment source | Admitting: Podiatry

## 2019-08-24 ENCOUNTER — Ambulatory Visit (INDEPENDENT_AMBULATORY_CARE_PROVIDER_SITE_OTHER): Payer: No Typology Code available for payment source

## 2019-08-24 ENCOUNTER — Encounter: Payer: Self-pay | Admitting: Podiatry

## 2019-08-24 ENCOUNTER — Other Ambulatory Visit: Payer: Self-pay

## 2019-08-24 VITALS — BP 144/88 | HR 84 | Temp 97.9°F | Resp 16

## 2019-08-24 DIAGNOSIS — M79671 Pain in right foot: Secondary | ICD-10-CM | POA: Diagnosis not present

## 2019-08-24 DIAGNOSIS — M7662 Achilles tendinitis, left leg: Secondary | ICD-10-CM

## 2019-08-24 DIAGNOSIS — M7661 Achilles tendinitis, right leg: Secondary | ICD-10-CM | POA: Diagnosis not present

## 2019-08-24 DIAGNOSIS — M79672 Pain in left foot: Secondary | ICD-10-CM

## 2019-08-24 MED ORDER — DICLOFENAC SODIUM 75 MG PO TBEC: 75.0000 mg | DELAYED_RELEASE_TABLET | Freq: Two times a day (BID) | ORAL | 2 refills | Status: DC

## 2019-08-24 NOTE — Progress Notes (Signed)
Subjective:   Patient ID: Amanda Dunn, female   DOB: 53 y.o.   MRN: [id number]   HPI Patient presents stating having a lot of pain in the back of the heel with both of them hurting but the left one worse recently with the right one having been more originally sore.  Patient states is been going on for around a year and patient uses pain patches ibuprofen which is given her minimal relief of symptoms.  Patient does not smoke likes to be active   Review of Systems  All other systems reviewed and are negative.       Objective:  Physical Exam Vitals and nursing note reviewed.  Constitutional:      Appearance: She is well-developed.  Pulmonary:     Effort: Pulmonary effort is normal.  Musculoskeletal:        General: Normal range of motion.  Skin:    General: Skin is warm.  Neurological:     Mental Status: She is alert.     Neurovascular status was found to be intact muscle strength found to be adequate range of motion within normal limits with moderate equinus condition noted bilateral.  Patient has discomfort posterior insertion Achilles left over right medial side with no center and mild lateral involvement     Assessment:  Acute Achilles tendinitis left over right medial side with inflammation at the insertion to the calcaneus     Plan:  Acute Achilles tendinitis bilateral with pain with H&P and x-rays reviewed today.  I discussed injection I explained risk of this and she wants to undergo injection we will do the left 1 first and I did sterile prep of the medial side carefully injected 3 mg dexamethasone 5 mg Xylocaine and applied a air fracture walker to immobilize.  Gave instructions for ice therapy stretching exercises and diclofenac to be beginning now and patient will be seen back 4 weeks or earlier if needed  X-rays indicate posterior spur with some detachment left over right

## 2019-08-24 NOTE — Telephone Encounter (Signed)
**  addition to previous phone message re: work note stating pt is safe to work in [employer] & that it is slip resistant.  Pt request phone call when request has been answered and would like it sent to her email as listed: [email or web address].

## 2019-08-24 NOTE — Patient Instructions (Signed)

## 2019-08-24 NOTE — Telephone Encounter (Signed)
yes

## 2019-08-24 NOTE — Telephone Encounter (Signed)
Pt was seen by regal today he put a boot on pt she wanted aletter to say it would be safe to wear to work if so she wanted a note to say that if possible

## 2019-08-24 NOTE — Progress Notes (Signed)
   Subjective:    Patient ID: Amanda Dunn, female    DOB: [birth year], 53 y.o.   MRN: [id number]  HPI    Review of Systems  All other systems reviewed and are negative.      Objective:   Physical Exam        Assessment & Plan:

## 2019-08-24 NOTE — Telephone Encounter (Signed)
I'm not sure if it is slip resistant. 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 If cannot wear to work should use at all other times

## 2019-08-25 NOTE — Telephone Encounter (Addendum)
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  Pt stated yes. Emailed note to pt.

## 2019-08-26 ENCOUNTER — Encounter: Payer: Self-pay | Admitting: *Deleted

## 2019-08-26 LAB — CYTOLOGY - PAP
Comment: NEGATIVE
Diagnosis: NEGATIVE
High risk HPV: NEGATIVE

## 2019-09-23 ENCOUNTER — Encounter: Payer: No Typology Code available for payment source | Admitting: Gastroenterology

## 2019-09-23 ENCOUNTER — Ambulatory Visit (INDEPENDENT_AMBULATORY_CARE_PROVIDER_SITE_OTHER): Payer: No Typology Code available for payment source | Admitting: Podiatry

## 2019-09-23 ENCOUNTER — Encounter: Payer: Self-pay | Admitting: Podiatry

## 2019-09-23 ENCOUNTER — Other Ambulatory Visit: Payer: Self-pay

## 2019-09-23 DIAGNOSIS — M7661 Achilles tendinitis, right leg: Secondary | ICD-10-CM

## 2019-09-23 DIAGNOSIS — M7662 Achilles tendinitis, left leg: Secondary | ICD-10-CM | POA: Diagnosis not present

## 2019-09-23 NOTE — Progress Notes (Signed)
Subjective:   Patient ID: Amanda Dunn, female   DOB: 54 y.o.   MRN: [id number]   HPI Patient states my left heel seems somewhat improved still sore if I am on it a lot but the boot really has helped and my right heel is really bad   ROS      Objective:  Physical Exam  Neurovascular status intact with posterior pain improving left heel with the right posterior heel being very tender medial side with patient also having equinus bilateral     Assessment:  Moderate improvement left with pain still present with exquisite discomfort right H     Plan:  P reviewed both conditions and for the right I did sterile prep injected the medial side 3 mg Dexasone Kenalog 5 mg Xylocaine and I applied night splint left and begin wearing the boot on the right with patient understanding risk.  Reappoint 4 weeks and hopefully will be out of her boot completely and just using the night splint and she will pursue aggressive ice therapy and be reevaluated

## 2019-10-15 NOTE — Progress Notes (Signed)
   CHIEF COMPLAINT / HPI:  Rotator cuff strain She reports right shoulder pain for about 1 month.  She does not remember any specific trauma.  She reports waking up one morning simply feeling a lot of discomfort behind her shoulder.  The pain is primarily a dull reproducible pain at the back of her shoulder.  She does occasionally notice shooting pain that moves down her arm.  Any motion that requires her to move her arm up is painful and she can feel herself compensating with her opposite shoulder.   PERTINENT  PMH / PSH: Noncontributory   OBJECTIVE: BP 140/82   Pulse 75   Wt 164 lb (74.4 kg)   SpO2 97%   BMI 30.00 kg/m    Shoulder Inspection: No structural or bony abnormalities Palpation: No significant tenderness to palpation of the shoulder, neck, arm ROM.  Reproducible pain with active range of motion.  Especially with abduction and internal rotation. Strength: 5/5 strength Neuro/vascular: Neurovascularly intact Special tests: Positive empty can test.  Positive Hawkins test  ASSESSMENT / PLAN:  Rotator cuff strain, right, initial encounter Likely strain of the subscapularis and supraspinatus.  Possibly some evidence of impingement as well. -Home exercises provided -Thera-Band provided -Encouraged oral NSAID use -Voltaren gel prescribed -Return to clinic in 4 weeks, okay to cancel visit if improving -Consider formal physical therapy if no improvement   Health maintenance -She is currently scheduled to have a colonoscopy and a mammogram done later this year. -She reports that she had an HIV and hepatitis C test done earlier this year due to work at [employer] health   Mirian Mo, MD [facility]

## 2019-10-16 ENCOUNTER — Other Ambulatory Visit: Payer: Self-pay

## 2019-10-16 ENCOUNTER — Ambulatory Visit (INDEPENDENT_AMBULATORY_CARE_PROVIDER_SITE_OTHER): Payer: No Typology Code available for payment source | Admitting: Family Medicine

## 2019-10-16 ENCOUNTER — Encounter: Payer: Self-pay | Admitting: Family Medicine

## 2019-10-16 DIAGNOSIS — S46011A Strain of muscle(s) and tendon(s) of the rotator cuff of right shoulder, initial encounter: Secondary | ICD-10-CM | POA: Diagnosis not present

## 2019-10-16 MED ORDER — DICLOFENAC SODIUM 1 % EX GEL: 2.0000 g | Freq: Four times a day (QID) | CUTANEOUS | 1 refills | Status: DC

## 2019-10-16 NOTE — Patient Instructions (Signed)
I think that you have a rotator cuff strain.  This will get better but it will take a little bit of time.  Please use the exercises that I provided and shoot for three sets per session and shoot for 1-2 sets per day.  The diclofenac pills that you are already taking are good medication for this rotator cuff issue.  You can try using voltaren gel. I put in an order but I'm not sure that it will be any cheaper with a prescription.  Please come back in 1 month.  If it does not seem better, we will move forward with formal physical therapy.

## 2019-10-16 NOTE — Assessment & Plan Note (Addendum)
Likely strain of the subscapularis and supraspinatus.  Possibly some evidence of impingement as well. -Home exercises provided -Thera-Band provided -Encouraged oral NSAID use -Voltaren gel prescribed -Return to clinic in 4 weeks, okay to cancel visit if improving -Consider formal physical therapy if no improvement

## 2019-10-21 ENCOUNTER — Ambulatory Visit: Payer: No Typology Code available for payment source | Admitting: Podiatry

## 2019-10-26 ENCOUNTER — Encounter: Payer: Self-pay | Admitting: Podiatry

## 2019-10-26 ENCOUNTER — Ambulatory Visit (INDEPENDENT_AMBULATORY_CARE_PROVIDER_SITE_OTHER): Payer: No Typology Code available for payment source | Admitting: Podiatry

## 2019-10-26 ENCOUNTER — Other Ambulatory Visit: Payer: Self-pay

## 2019-10-26 VITALS — Temp 96.7°F

## 2019-10-26 DIAGNOSIS — M79671 Pain in right foot: Secondary | ICD-10-CM

## 2019-10-26 DIAGNOSIS — M7662 Achilles tendinitis, left leg: Secondary | ICD-10-CM | POA: Diagnosis not present

## 2019-10-26 DIAGNOSIS — M79672 Pain in left foot: Secondary | ICD-10-CM

## 2019-10-26 DIAGNOSIS — M7661 Achilles tendinitis, right leg: Secondary | ICD-10-CM | POA: Diagnosis not present

## 2019-10-27 NOTE — Progress Notes (Signed)
Subjective:   Patient ID: Amanda Dunn, female   DOB: 54 y.o.   MRN: [id number]   HPI Patient presents stating that overall she is doing better but she still has some discomfort in the back of the heels right over left   ROS      Objective:  Physical Exam  Neurovascular status intact with patient's posterior heel still sore when pressed but improved from previous with patient walking with a better heel toe gait with boot in place currently and patient recovering slowly from the inflammatory processes occurring and continuing to take medication     Assessment:  Achilles tendinitis present with improvement but still pain     Plan:  H&P reviewed anti-inflammatory stretching exercises ice therapy and boot usage.  Patient will be seen back and was also dispensed heel lifts today and I did dispense a second night splint as that is been very helpful but she can only do one at a time and wants to be able to do them both at the same time

## 2019-11-25 ENCOUNTER — Telehealth: Payer: Self-pay | Admitting: Podiatry

## 2019-11-25 NOTE — Telephone Encounter (Signed)
Unable to inform pt that I could write a note for her to be out of work until reevaluated 12/07/2019. Emailed to pt's currently listed email.

## 2019-11-25 NOTE — Telephone Encounter (Signed)
Patient is requesting a note to be off on weekends. Has heel spur & is wearing boot at work but they are wanting her to work weekends but she feels she needs that time to stretch, soak, etc.  Pls advise.

## 2019-11-25 NOTE — Telephone Encounter (Signed)
Patient is calling back to request a medical doctor's note , heel is still swollen,is working 7 days a week and needs time off for heel to heal.

## 2019-11-26 NOTE — Telephone Encounter (Signed)
Unable to leave a message mailbox is full. 

## 2019-11-26 NOTE — Telephone Encounter (Signed)
Pt states the note does not say what she wants it to say.

## 2019-11-30 NOTE — Telephone Encounter (Signed)
Emailed 11/30/2019 letter for pt to have a 5 day work week until reevaluated 12/07/2019.

## 2019-12-07 ENCOUNTER — Encounter: Payer: Self-pay | Admitting: Podiatry

## 2019-12-07 ENCOUNTER — Other Ambulatory Visit: Payer: Self-pay

## 2019-12-07 ENCOUNTER — Ambulatory Visit: Payer: No Typology Code available for payment source | Admitting: Podiatry

## 2019-12-07 VITALS — Temp 97.1°F

## 2019-12-07 DIAGNOSIS — M7661 Achilles tendinitis, right leg: Secondary | ICD-10-CM

## 2019-12-07 DIAGNOSIS — M7662 Achilles tendinitis, left leg: Secondary | ICD-10-CM | POA: Diagnosis not present

## 2019-12-07 NOTE — Progress Notes (Signed)
Subjective:   Patient ID: Amanda Dunn, female   DOB: 54 y.o.   MRN: [id number]   HPI Patient states overall she is improved from where she was but on the outside of the right one the heel has been hurting quite a bit.  Patient states that she feels like if she can get this 1 spot that that would really help her and it is a different spot and also she has trouble working weekends due to the pain   ROS      Objective:  Physical Exam  Neurovascular status intact with posterior lateral aspect tendinitis right with a central or medial part of the tendon doing well currently with patient having mood and night splint but needs a second night splint for the right foot     Assessment:  Acute Achilles tendinitis right with inflammation fluid more on the lateral side     Plan:  H&P reviewed condition and I did discuss the lateral injection explaining the risk of doing this and everything is that indicates.  Patient is willing to accept that risk and at this point I did go ahead and I did a careful injection of the outside of the right Achilles not into the center medial with 3 mg dexamethasone 5 mg Xylocaine and advised her to wear her air fracture walker and I did dispense a new night splint that I want her to use in the evenings.  Reappoint for [place] to recheck again in 4 weeks we will stop we can work currently

## 2020-01-01 ENCOUNTER — Ambulatory Visit (INDEPENDENT_AMBULATORY_CARE_PROVIDER_SITE_OTHER): Payer: No Typology Code available for payment source | Admitting: Family Medicine

## 2020-01-01 ENCOUNTER — Other Ambulatory Visit: Payer: Self-pay

## 2020-01-01 ENCOUNTER — Encounter: Payer: Self-pay | Admitting: Family Medicine

## 2020-01-01 VITALS — BP 124/60 | HR 88 | Ht 62.0 in | Wt 156.6 lb

## 2020-01-01 DIAGNOSIS — M7581 Other shoulder lesions, right shoulder: Secondary | ICD-10-CM | POA: Insufficient documentation

## 2020-01-01 NOTE — Progress Notes (Signed)
    SUBJECTIVE:   CHIEF COMPLAINT / HPI:   Right rotator cuff tendinitis Agree with Dr. Chalmers Guest prior work-up.  Patient now has (limited by pain) passive and active restriction beyond 90 degrees to AB duction and flexion.  She has good good ability to grip and use her hand if she is not moving her shoulder but anything that requires force of the shoulder particularly with any significant range of motion causes significant pain.  Patient has been guarding the side of her arm and has been significantly restricted in range of motion for the last 2 months according to her.  She does not remember any specific moment of injury but says this is now been "months and months "   PERTINENT  PMH / PSH:   OBJECTIVE:   BP 124/60   Pulse 88   Ht 5\' 2"  (1.575 m)   Wt 156 lb 9.6 oz (71 kg)   SpO2 99%   BMI 28.64 kg/m   Patient now has (limited by pain) passive and active restriction beyond 90 degrees to AB duction and flexion.  She has good distal grip strength and no sensation deficits but has difficulty with empty can test and external rotation.  No bony deformation palpable to shoulder, no erythema or cellulitis, no visual deformation.  No crepitus felt.  ASSESSMENT/PLAN:   Right rotator cuff tendinitis Agree with Dr. prior work-up.  Patient now has (limited by pain) passive and active restriction beyond 90 degrees to AB duction and flexion.  She has good distal grip strength and no sensation deficits but has difficulty with empty can test and external rotation.  At this point I expect some combination of likely supraspinatus injury along with developing adhesive capsulitis because she has not been using her arm significantly for the last 2 months.  Discussed wall walking exercises and in place referral to sports medicine for further evaluation.  Patient declines PT at this time because she says she works too many hours and needs to speak to sports before she accepts a PT referral.     Chalmers Guest, DO [facility]

## 2020-01-01 NOTE — Patient Instructions (Addendum)
Today we talked about the fact that I think that you have a right rotator cuff tendinitis.  You said that you would prefer to go see sports medicine as opposed to to start physical therapy and I think that a more exact diagnosis this would be a good decision.  I think that you can continue to use ibuprofen over-the-counter, you can take up to 800 mg at a time up to twice per day.  You can also use Tylenol which I think would be very helpful.  You can use 500 to 1000 mg up to twice per day.  If you do not hear from the sports medicine folks about an appointment in the next week please call our office to verify that the referral had been sent across but I will put in the computer now.  Dr. Criss Rosales Rotator Cuff Tendinitis  Rotator cuff tendinitis is inflammation of the tough, cord-like bands that connect muscle to bone (tendons) in the rotator cuff. The rotator cuff includes all of the muscles and tendons that connect the arm to the shoulder. The rotator cuff holds the head of the upper arm bone (humerus) in the cup (fossa) of the shoulder blade (scapula). This condition can lead to a long-lasting (chronic) tear. The tear may be partial or complete. What are the causes? This condition is usually caused by overusing the rotator cuff. What increases the risk? This condition is more likely to develop in athletes and workers who frequently use their shoulder or reach over their heads. This can include activities such as:  Tennis.  Baseball or softball.  Swimming.  Construction work.  Painting. What are the signs or symptoms? Symptoms of this condition include:  Pain spreading (radiating) from the shoulder to the upper arm.  Swelling and tenderness in front of the shoulder.  Pain when reaching, pulling, or lifting the arm above the head.  Pain when lowering the arm from above the head.  Minor pain in the shoulder when resting.  Increased pain in the shoulder at night.  Difficulty placing  the arm behind the back. How is this diagnosed? This condition is diagnosed with a medical history and physical exam. Tests may also be done, including:  X-rays.  MRI.  Ultrasounds.  CT or MR arthrogram. During this test, a contrast material is injected and then images are taken. How is this treated? Treatment for this condition depends on the severity of the condition. In less severe cases, treatment may include:  Rest. This may be done with a sling that holds the shoulder still (immobilization). Your health care provider may also recommend avoiding activities that involve lifting your arm over your head.  Icing the shoulder.  Anti-inflammatory medicines, such as aspirin or ibuprofen. In more severe cases, treatment may include:  Physical therapy.  Steroid injections.  Surgery. Follow these instructions at home: If you have a sling:  Wear the sling as told by your health care provider. Remove it only as told by your health care provider.  Loosen the sling if your fingers tingle, become numb, or turn cold and blue.  Keep the sling clean.  If the sling is not waterproof, do not let it get wet. Remove it, if allowed, or cover it with a watertight covering when you take a bath or shower. Managing pain, stiffness, and swelling  If directed, put ice on the injured area. ? If you have a removable sling, remove it as told by your health care provider. ? Put ice in  a plastic bag. ? Place a towel between your skin and the bag. ? Leave the ice on for 20 minutes, 2-3 times a day.  Move your fingers often to avoid stiffness and to lessen swelling.  Raise (elevate) the injured area above the level of your heart while you are lying down.  Find a comfortable sleeping position or sleep on a recliner, if available. Driving  Do not drive or use heavy machinery while taking prescription pain medicine.  Ask your health care provider when it is safe to drive if you have a sling on  your arm. Activity  Rest your shoulder as told by your health care provider.  Return to your normal activities as told by your health care provider. Ask your health care provider what activities are safe for you.  Do any exercises or stretches as told by your health care provider.  If you do repetitive overhead tasks, take small breaks in between and include stretching exercises as told by your health care provider. General instructions  Do not use any products that contain nicotine or tobacco, such as cigarettes and e-cigarettes. These can delay healing. If you need help quitting, ask your health care provider.  Take over-the-counter and prescription medicines only as told by your health care provider.  Keep all follow-up visits as told by your health care provider. This is important. Contact a health care provider if:  Your pain gets worse.  You have new pain in your arm, hands, or fingers.  Your pain is not relieved with medicine or does not get better after 6 weeks of treatment.  You have cracking sensations when moving your shoulder in certain directions.  You hear a snapping sound after using your shoulder, followed by severe pain and weakness. Get help right away if:  Your arm, hand, or fingers are numb or tingling.  Your arm, hand, or fingers are swollen or painful or they turn white or blue. Summary  Rotator cuff tendinitis is inflammation of the tough, cord-like bands that connect muscle to bone (tendons) in the rotator cuff.  This condition is usually caused by overusing the rotator cuff, which includes all of the muscles and tendons that connect the arm to the shoulder.  This condition is more likely to develop in athletes and workers who frequently use their shoulder or reach over their heads.  Treatment generally includes rest, anti-inflammatory medicines, and icing. In some cases, physical therapy and steroid injections may be needed. In severe cases, surgery  may be needed. This information is not intended to replace advice given to you by your health care provider. Make sure you discuss any questions you have with your health care provider. Document Revised: 12/19/2018 Document Reviewed: 08/13/2016 Elsevier Patient Education  2020 [employer].

## 2020-01-02 NOTE — Assessment & Plan Note (Signed)
Agree with Dr. Chalmers Guest prior work-up.  Patient now has (limited by pain) passive and active restriction beyond 90 degrees to AB duction and flexion.  She has good distal grip strength and no sensation deficits but has difficulty with empty can test and external rotation.  At this point I expect some combination of likely supraspinatus injury along with developing adhesive capsulitis because she has not been using her arm significantly for the last 2 months.  Discussed wall walking exercises and in place referral to sports medicine for further evaluation.  Patient declines PT at this time because she says she works too many hours and needs to speak to sports before she accepts a PT referral.

## 2020-01-05 ENCOUNTER — Ambulatory Visit (HOSPITAL_COMMUNITY)
Admission: EM | Admit: 2020-01-05 | Discharge: 2020-01-05 | Disposition: A | Discharge status: Home / Self Care | Payer: No Typology Code available for payment source | Attending: Family Medicine | Admitting: Family Medicine

## 2020-01-05 DIAGNOSIS — Z20822 Contact with and (suspected) exposure to covid-19: Secondary | ICD-10-CM | POA: Insufficient documentation

## 2020-01-05 DIAGNOSIS — R197 Diarrhea, unspecified: Secondary | ICD-10-CM | POA: Insufficient documentation

## 2020-01-05 DIAGNOSIS — Z79899 Other long term (current) drug therapy: Secondary | ICD-10-CM | POA: Insufficient documentation

## 2020-01-05 DIAGNOSIS — R112 Nausea with vomiting, unspecified: Secondary | ICD-10-CM | POA: Diagnosis not present

## 2020-01-05 DIAGNOSIS — I10 Essential (primary) hypertension: Secondary | ICD-10-CM | POA: Diagnosis not present

## 2020-01-05 DIAGNOSIS — Z7901 Long term (current) use of anticoagulants: Secondary | ICD-10-CM | POA: Insufficient documentation

## 2020-01-05 MED ORDER — ONDANSETRON 8 MG PO TBDP: 8.0000 mg | ORAL_TABLET | Freq: Three times a day (TID) | ORAL | 0 refills | Status: DC | PRN

## 2020-01-05 MED ORDER — ONDANSETRON 4 MG PO TBDP
8.0000 mg | ORAL_TABLET | Freq: Once | ORAL | Status: AC
  Administered 2020-01-05: 13:00:00 8 mg via ORAL

## 2020-01-05 MED ORDER — ONDANSETRON 4 MG PO TBDP
ORAL_TABLET | ORAL | Status: AC
  Filled 2020-01-05: qty 2

## 2020-01-05 NOTE — ED Triage Notes (Signed)
Pt c/o vomiting and diarrhea since Sunday, approx 1 hour after eating tuna wrap.

## 2020-01-05 NOTE — Discharge Instructions (Signed)
Your nausea, vomiting, and diarrhea appear to have a viral cause and should resolve with time. COVID test pending.  For nausea: Zofran prescribed. Begin with every 8 hours, than as you are able to hold food down, take it as needed. Start with clear liquids, then move to plain foods like bananas, rice, applesauce, toast, broth, grits, oatmeal. As those food settle okay you may transition to your normal foods. Avoid spicy and greasy foods as much as possible.  For Diarrhea: This is your body's natural way of getting rid of a virus. You may try taking 1 imodium to decrease amount of stools a day, but we do not want you to stop your diarrhea.   Preventing dehydration is key! You need to replace the fluid your body is expelling. Drink plenty of fluids, may use Pedialyte or sports drinks.   Please return if you are experiencing blood in your vomit or stool or experiencing dizziness, lightheadedness, extreme fatigue, increased abdominal pain.

## 2020-01-06 LAB — SARS CORONAVIRUS 2 (TAT 6-24 HRS): SARS Coronavirus 2: NEGATIVE

## 2020-01-06 NOTE — ED Provider Notes (Signed)
[facility]    CSN: [id number] Arrival date & time: 01/05/20  1209      History   Chief Complaint Chief Complaint  Patient presents with  . Emesis  . Diarrhea    HPI Amanda Dunn is a 54 y.o. female history of hypertension presenting today for evaluation of nausea vomiting and diarrhea.  Patient states that symptoms began on Sunday, approximately 2 days ago.  Symptoms began approximately 1 hour after she ate a tuna wrap.  She has had some intermittent abdominal pain which has come and gone with her symptoms.  Has had frequent bowels.  Denies blood in stool or vomit.  Has had difficulty tolerating oral intake including liquids at times.  Denies URI symptoms.  Denies close contacts with similar symptoms.  HPI  Past Medical History:  Diagnosis Date  . Hypertension     Patient Active Problem List   Diagnosis Date Noted  . Right rotator cuff tendinitis 01/01/2020  . Rotator cuff strain, right, initial encounter 10/16/2019  . HTN, goal below 140/90 08/16/2019  . History of rheumatic fever 08/16/2019  . Ankle bone spur, bilateraly  08/16/2019    No past surgical history on file.  OB History   No obstetric history on file.      Home Medications    Prior to Admission medications   Medication Sig Start Date End Date Taking? Authorizing Provider  amLODipine (NORVASC) 10 MG tablet Take 1 tablet (10 mg total) by mouth at bedtime. 08/13/19  Yes Stark Klein, MD  ondansetron (ZOFRAN ODT) 8 MG disintegrating tablet Take 1 tablet (8 mg total) by mouth every 8 (eight) hours as needed for nausea or vomiting. 01/05/20   Nyeemah Jennette, Elesa Hacker, PA-C    Family History Family History  Problem Relation Age of Onset  . Hypertension Mother   . Heart failure Mother   . Hypertension Father   . Heart failure Father     Social History Social History   Tobacco Use  . Smoking status: Never Smoker  . Smokeless tobacco: Never Used  Substance Use Topics  . Alcohol use: Not  Currently  . Drug use: Never     Allergies   Diclofenac   Review of Systems Review of Systems  Constitutional: Negative for activity change, appetite change, chills, fatigue and fever.  HENT: Negative for congestion, ear pain, rhinorrhea, sinus pressure, sore throat and trouble swallowing.   Eyes: Negative for discharge and redness.  Respiratory: Negative for cough, chest tightness and shortness of breath.   Cardiovascular: Negative for chest pain.  Gastrointestinal: Positive for abdominal pain, diarrhea, nausea and vomiting.  Musculoskeletal: Negative for myalgias.  Skin: Negative for rash.  Neurological: Negative for dizziness, light-headedness and headaches.     Physical Exam Triage Vital Signs ED Triage Vitals  Enc Vitals Group     BP 01/05/20 1232 (!) 178/86     Pulse Rate 01/05/20 1230 77     Resp 01/05/20 1230 16     Temp 01/05/20 1230 98 F (36.7 C)     Temp src --      SpO2 01/05/20 1230 100 %     Weight --      Height --      Head Circumference --      Peak Flow --      Pain Score 01/05/20 1231 9     Pain Loc --      Pain Edu? --      Excl. in [place]? --  No data found.  Updated Vital Signs BP (!) 178/86   Pulse 77   Temp 98 F (36.7 C)   Resp 16   SpO2 100%   Visual Acuity Right Eye Distance:   Left Eye Distance:   Bilateral Distance:    Right Eye Near:   Left Eye Near:    Bilateral Near:     Physical Exam Vitals and nursing note reviewed.  Constitutional:      Appearance: She is well-developed.     Comments: No acute distress  HENT:     Head: Normocephalic and atraumatic.     Nose: Nose normal.     Mouth/Throat:     Comments: Oral mucosa pink and moist, no tonsillar enlargement or exudate. Posterior pharynx patent and nonerythematous, no uvula deviation or swelling. Normal phonation.  Eyes:     Conjunctiva/sclera: Conjunctivae normal.  Cardiovascular:     Rate and Rhythm: Normal rate.  Pulmonary:     Effort: Pulmonary effort is  normal. No respiratory distress.     Comments: Breathing comfortably at rest, CTABL, no wheezing, rales or other adventitious sounds auscultated Abdominal:     General: There is no distension.     Comments: Soft, nondistended, nontender to light and deep palpation throughout abdomen  Musculoskeletal:        General: Normal range of motion.     Cervical back: Neck supple.  Skin:    General: Skin is warm and dry.  Neurological:     Mental Status: She is alert and oriented to person, place, and time.      UC Treatments / Results  Labs (all labs ordered are listed, but only abnormal results are displayed) Labs Reviewed  SARS CORONAVIRUS 2 (TAT 6-24 HRS)    EKG   Radiology No results found.  Procedures Procedures (including critical care time)  Medications Ordered in UC Medications  ondansetron (ZOFRAN-ODT) disintegrating tablet 8 mg (8 mg Oral Given 01/05/20 1325)    Initial Impression / Assessment and Plan / UC Course  I have reviewed the triage vital signs and the nursing notes.  Pertinent labs & imaging results that were available during my care of the patient were reviewed by me and considered in my medical decision making (see chart for details).     Provided Zofran with mild improvement of nausea.  Sending home with Zofran to use around-the-clock.  Suspect symptoms most likely viral etiology.  Negative peritoneal signs, do not suspect gallbladder or appendicitis pathology at this time.  Advised close monitoring of abdominal pain.  Covid PCR pending to rule out.  Recommending pushing fluids, slowly transitioning diet.  Discussed strict return precautions. Patient verbalized understanding and is agreeable with plan.  Final Clinical Impressions(s) / UC Diagnoses   Final diagnoses:  Nausea vomiting and diarrhea     Discharge Instructions     Your nausea, vomiting, and diarrhea appear to have a viral cause and should resolve with time. COVID test pending.  For  nausea: Zofran prescribed. Begin with every 8 hours, than as you are able to hold food down, take it as needed. Start with clear liquids, then move to plain foods like bananas, rice, applesauce, toast, broth, grits, oatmeal. As those food settle okay you may transition to your normal foods. Avoid spicy and greasy foods as much as possible.  For Diarrhea: This is your body's natural way of getting rid of a virus. You may try taking 1 imodium to decrease amount of stools a day, but  we do not want you to stop your diarrhea.   Preventing dehydration is key! You need to replace the fluid your body is expelling. Drink plenty of fluids, may use Pedialyte or sports drinks.   Please return if you are experiencing blood in your vomit or stool or experiencing dizziness, lightheadedness, extreme fatigue, increased abdominal pain.    ED Prescriptions    Medication Sig Dispense Auth. Provider   ondansetron (ZOFRAN ODT) 8 MG disintegrating tablet Take 1 tablet (8 mg total) by mouth every 8 (eight) hours as needed for nausea or vomiting. 24 tablet Addasyn Mcbreen, [place] C, PA-C     PDMP not reviewed this encounter.   Kurstyn Larios, [place] C, PA-C 01/06/20 1057

## 2020-01-15 ENCOUNTER — Encounter: Payer: Self-pay | Admitting: Sports Medicine

## 2020-01-15 ENCOUNTER — Ambulatory Visit: Payer: Self-pay

## 2020-01-15 ENCOUNTER — Other Ambulatory Visit: Payer: Self-pay

## 2020-01-15 ENCOUNTER — Ambulatory Visit (INDEPENDENT_AMBULATORY_CARE_PROVIDER_SITE_OTHER): Payer: No Typology Code available for payment source | Admitting: Sports Medicine

## 2020-01-15 VITALS — BP 124/86 | Ht 62.0 in | Wt 156.0 lb

## 2020-01-15 DIAGNOSIS — M7581 Other shoulder lesions, right shoulder: Secondary | ICD-10-CM

## 2020-01-15 MED ORDER — MELOXICAM 15 MG PO TABS: 15.0000 mg | ORAL_TABLET | Freq: Every day | ORAL | 1 refills | Status: DC

## 2020-01-15 NOTE — Progress Notes (Addendum)
    SUBJECTIVE:   CHIEF COMPLAINT / HPI:   Right shoulder pain Amanda Dunn is a 54 y.o. female who presents with follows up for right shoulder pain.  Patient reports that it is hurt for 1 to 2 months.  Pain cause her to wake up at night.  Patient cannot lay on that side of the shoulder.  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  Denies any injury to the shoulder.  Patient was seen in family medicine clinic and was referred here after minimal improvement with NSAIDs and home exercises.  Patient is tried ibuprofen, "arthritis cream" for pain with minimal improvement.   PERTINENT  PMH / PSH:  Prior history of shoulder dislocation on the right as a child  OBJECTIVE:   BP 124/86   Ht 5\' 2"  (1.575 m)   Wt 156 lb (70.8 kg)   BMI 28.53 kg/m   General: NAD Right Shoulder Inspection: no gross effusion or contusion Palpation: tender to palpation over the anterior portion of shoulder ROM: limited active external rotation and abduction due to pain Strength: 5/5 strength in bicept Stability: shoulder joint stable Special tests: Speeds negative, empty can test negative, Neer's negative, scarf test negative Vascular studies: NVI   ASSESSMENT/PLAN:   Acute on chronic anterior right rotator cuff tendinoplasty Exam and ultrasound consistent with supraspinatus tendinopathy. -Mobic 7.5 mg twice daily as needed -Physical therapy -Follow-up in 2 month  , MD [facility]   I was the preceptor for this visit and available for immediate consultation [facility], DO

## 2020-01-27 NOTE — Addendum Note (Signed)
Addended by: Rutha Bouchard E on: 01/27/2020 09:29 AM   Modules accepted: Orders

## 2020-02-01 ENCOUNTER — Ambulatory Visit
Payer: No Typology Code available for payment source | Attending: Sports Medicine | Admitting: Rehabilitative and Restorative Service Providers"

## 2020-02-01 ENCOUNTER — Encounter: Payer: Self-pay | Admitting: Podiatry

## 2020-02-01 ENCOUNTER — Other Ambulatory Visit: Payer: Self-pay

## 2020-02-01 ENCOUNTER — Ambulatory Visit (INDEPENDENT_AMBULATORY_CARE_PROVIDER_SITE_OTHER): Payer: No Typology Code available for payment source | Admitting: Podiatry

## 2020-02-01 VITALS — Temp 97.2°F

## 2020-02-01 DIAGNOSIS — M79671 Pain in right foot: Secondary | ICD-10-CM | POA: Diagnosis not present

## 2020-02-01 DIAGNOSIS — M7662 Achilles tendinitis, left leg: Secondary | ICD-10-CM | POA: Diagnosis not present

## 2020-02-01 DIAGNOSIS — M7661 Achilles tendinitis, right leg: Secondary | ICD-10-CM | POA: Diagnosis not present

## 2020-02-01 DIAGNOSIS — M79672 Pain in left foot: Secondary | ICD-10-CM

## 2020-02-01 MED ORDER — TRAMADOL HCL 50 MG PO TABS: 50.0000 mg | ORAL_TABLET | Freq: Three times a day (TID) | ORAL | 2 refills | Status: DC

## 2020-02-03 NOTE — Progress Notes (Signed)
Subjective:   Patient ID: Amanda Dunn, female   DOB: 54 y.o.   MRN: [id number]   HPI Patient states she seems improved but she still gets pain in the back of her heel and she is still trying to use her boot as much as possible ice therapy and uses her weekends to rest so she is able to work during the week   ROS      Objective:  Physical Exam  Neurovascular status intact with patient's right posterior lateral heel showing continued swelling inflammation but improved from previous visit     Assessment:  Chronic Achilles tendinitis right which we are continuing to work with and seems to be gradually improving     Plan:  H&P reviewed condition and I have recommended the continuation of aggressive ice therapy anti-inflammatories oral topical medication and stretch. Patient will be seen back as indicated may require ultimately more aggressive surgery but we will continue to work with this conservatively as best as possible

## 2020-02-19 ENCOUNTER — Ambulatory Visit: Payer: No Typology Code available for payment source | Admitting: Pediatrics

## 2020-02-26 ENCOUNTER — Ambulatory Visit: Payer: No Typology Code available for payment source | Admitting: Sports Medicine

## 2020-03-02 ENCOUNTER — Ambulatory Visit: Payer: No Typology Code available for payment source | Admitting: Sports Medicine

## 2020-03-07 ENCOUNTER — Other Ambulatory Visit: Payer: Self-pay | Admitting: Occupational Medicine

## 2020-03-07 ENCOUNTER — Other Ambulatory Visit: Payer: Self-pay

## 2020-03-07 ENCOUNTER — Ambulatory Visit: Payer: Self-pay

## 2020-03-07 DIAGNOSIS — M545 Low back pain, unspecified: Secondary | ICD-10-CM

## 2020-04-04 ENCOUNTER — Ambulatory Visit: Payer: No Typology Code available for payment source | Admitting: Podiatry

## 2020-04-04 ENCOUNTER — Other Ambulatory Visit (HOSPITAL_COMMUNITY): Payer: Self-pay | Admitting: Orthopedic Surgery

## 2020-04-04 DIAGNOSIS — M545 Low back pain, unspecified: Secondary | ICD-10-CM

## 2020-04-12 ENCOUNTER — Ambulatory Visit (HOSPITAL_COMMUNITY)
Admission: RE | Admit: 2020-04-12 | Discharge: 2020-04-12 | Disposition: A | Discharge status: Home / Self Care | Payer: PRIVATE HEALTH INSURANCE | Source: Ambulatory Visit | Attending: Orthopedic Surgery | Admitting: Orthopedic Surgery

## 2020-04-12 ENCOUNTER — Other Ambulatory Visit: Payer: Self-pay

## 2020-04-12 DIAGNOSIS — M545 Low back pain, unspecified: Secondary | ICD-10-CM

## 2020-11-09 ENCOUNTER — Other Ambulatory Visit: Payer: Self-pay

## 2020-11-09 ENCOUNTER — Ambulatory Visit (INDEPENDENT_AMBULATORY_CARE_PROVIDER_SITE_OTHER)
Payer: No Typology Code available for payment source | Admitting: Student in an Organized Health Care Education/Training Program

## 2020-11-09 VITALS — BP 190/92 | HR 81 | Wt 165.2 lb

## 2020-11-09 DIAGNOSIS — G5702 Lesion of sciatic nerve, left lower limb: Secondary | ICD-10-CM

## 2020-11-09 DIAGNOSIS — I10 Essential (primary) hypertension: Secondary | ICD-10-CM

## 2020-11-09 MED ORDER — AMLODIPINE BESYLATE 10 MG PO TABS: 10.0000 mg | ORAL_TABLET | Freq: Every day | ORAL | 0 refills | Status: DC

## 2020-11-09 MED ORDER — DICLOFENAC SODIUM 1 % EX GEL: 2.0000 g | Freq: Four times a day (QID) | CUTANEOUS | 0 refills | Status: DC

## 2020-11-09 MED ORDER — GABAPENTIN 600 MG PO TABS: 600.0000 mg | ORAL_TABLET | Freq: Every day | ORAL | 0 refills | Status: DC

## 2020-11-09 NOTE — Progress Notes (Signed)
   SUBJECTIVE:   CHIEF COMPLAINT / HPI: back and leg pain  HTN- No symptoms Took Amlodipine 10mg  at 1015 (right before appointment time) with coffee and running late and in pain No cuff at home Endorses compliance  Back pain-  Chronic back pain but this is different and has been worsening over the past 2 weeks.  Denies accident or injury to the area.  Believes that it may be related to changes in her work habits.  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  About 8 months ago she got a steroid injection in her back which made the pain a lot better. It is present on her left side only.  She takes 800 mg ibuprofen 1-2 times per day which helps significantly but when it wears off she is in really bad pain.  It does radiate down her leg. Denies weakness, numbness tingling No positions better or worse Hurts all the time interfering with sleep Nagging uncomfortable pain No rashes.   OBJECTIVE:   BP (!) 190/92   Pulse 81   Wt 165 lb 3.2 oz (74.9 kg)   SpO2 99%   BMI 30.22 kg/m   BP rechecked by provider ~170/90 Physical Exam Vitals and nursing note reviewed.  Constitutional:      General: She is not in acute distress.    Appearance: She is normal weight.  HENT:     Head: Normocephalic.  Cardiovascular:     Rate and Rhythm: Normal rate and regular rhythm.     Heart sounds: Normal heart sounds. No murmur heard.   Pulmonary:     Effort: Pulmonary effort is normal.  Musculoskeletal:     Lumbar back: Negative right straight leg raise test and negative left straight leg raise test.     Right hip: No bony tenderness. Normal range of motion. Normal strength.     Left hip: No bony tenderness. Normal range of motion. Normal strength.     Right lower leg: No swelling.     Left lower leg: No swelling.     Comments: Positive tenderness over left sciatic nerve area. Worse with resisted flexion and extension of hip.  Neurological:     Mental Status: She is  alert.    ASSESSMENT/PLAN:   HTN, goal below 140/90 Elevated to 190/92 today but lowered on repeat evaluation by provider to 170/90.  Patient endorses just taking her blood pressure medicine right before the appointment and is in significant pain and was rushing. Patient needs refill of her amlodipine which I sent in today. Recommended patient follow-up with her PCP in 2 weeks to reevaluate Patient is asymptomatic today  Piriformis syndrome Tenderness on exam and history significant for inflammation of the sciatic nerve. Denies any red flag symptoms or traumatic injury. -Gabapentin -Voltaren gel -Heat or ice -Referred for physical therapy -Return in 2 weeks if not improving     , DO [facility]

## 2020-11-09 NOTE — Patient Instructions (Signed)
It was a pleasure to see you today!  To summarize our discussion for this visit:  For your leg pain  Gabapentin  voltaren gel  Physical therapy  Heat or cold to see what helps you best  Follow up with your primary doctor in 2 weeks  Blood pressure  Refilled amlodipine.  Some additional health maintenance measures we should update are: Health Maintenance Due  Topic Date Due  . Hepatitis C Screening  Never done  . HIV Screening  Never done  . COLONOSCOPY (Pts 45-55yrs Insurance coverage will need to be confirmed)  Never done  . MAMMOGRAM  Never done  . INFLUENZA VACCINE  04/10/2020  .   Please return to our clinic to see PCP in 2 weeks for blood pressure.  Call the clinic at [phone] if your symptoms worsen or you have any concerns.   Thank you for allowing me to take part in your care,  Dr. Jamelle Rushing  Piriformis Syndrome  Piriformis syndrome is a condition that can cause pain and numbness in your buttocks and down the back of your leg. Piriformis syndrome happens when the small muscle that connects the base of your spine to your hip (piriformis muscle) presses on the nerve that runs down the back of your leg (sciatic nerve). The piriformis muscle helps your hip rotate and helps to bring your leg back and out. It also helps shift your weight to keep you stable while you are walking. The sciatic nerve runs under or through the piriformis muscle. Damage to the piriformis muscle can cause spasms that put pressure on the nerve below. This causes pain and discomfort while sitting and moving. The pain may feel as if it begins in the buttock and spreads (radiates) down your hip and thigh. What are the causes? This condition is caused by pressure on the sciatic nerve from the piriformis muscle. The piriformis muscle can get irritated with overuse, especially if other hip muscles are weak and the piriformis muscle has to do extra work. Piriformis syndrome can also occur  after an injury, like a fall onto your buttocks. What increases the risk? You are more likely to develop this condition if you:  Are a woman.  Sit for long periods of time.  Are a cyclist.  Have weak buttocks muscles (gluteal muscles). What are the signs or symptoms? Symptoms of this condition include:  Pain, tingling, or numbness that starts in the buttock and runs down the back of your leg (sciatica).  Pain in the groin or thigh area. Your symptoms may get worse:  The longer you sit.  When you walk, run, or climb stairs.  When straining to have a bowel movement. How is this diagnosed? This condition is diagnosed based on your symptoms, medical history, and physical exam.  During the exam, your health care provider may: ? Move your leg into different positions to check for pain. ? Press on the muscles of your hip and buttock to see if that increases your symptoms.  You may also have tests, including: ? Imaging tests such as X-rays, MRI, or ultrasound. ? Electromyogram (EMG). This test measures electrical signals sent by your nerves into the muscles. ? Nerve conduction study. This test measures how well electrical signals pass through your nerves. How is this treated? This condition may be treated by:  Stopping all activities that cause pain or make your condition worse.  Applying ice or using heat therapy.  Taking medicines to reduce pain and swelling.  Taking a muscle relaxer (muscle relaxant) to stop muscle spasms.  Doing range-of-motion and strengthening exercises (physical therapy) as told by your health care provider.  Massaging the area.  Having acupuncture.  Getting an injection of medicine in the piriformis muscle. Your health care provider will choose the medicine based on your condition. He or she may inject: ? An anti-inflammatory medicine (steroid) to reduce swelling. ? A numbing medicine (local anesthetic) to block the pain. ? Botulinum toxin. The  toxin blocks nerve impulses to specific muscles to reduce muscle tension. In rare cases, you may need surgery to cut the muscle and release pressure on the nerve if other treatments do not work. Follow these instructions at home: Activity  Do not sit for long periods. Get up and walk around every 20 minutes or as often as told by your health care provider. ? When driving long distances, make sure to take frequent stops to get up and stretch.  Use a cushion when you sit on hard surfaces.  Do exercises as told by your health care provider.  Return to your normal activities as told by your health care provider. Ask your health care provider what activities are safe for you. Managing pain, stiffness, and swelling  If directed, apply heat to the affected area as often as told by your health care provider. Use the heat source that your health care provider recommends, such as a moist heat pack or a heating pad. ? Place a towel between your skin and the heat source. ? Leave the heat on for 20-30 minutes. ? Remove the heat if your skin turns bright red. This is especially important if you are unable to feel pain, heat, or cold. You may have a greater risk of getting burned.  If directed, put ice on the injured area. ? Put ice in a plastic bag. ? Place a towel between your skin and the bag. ? Leave the ice on for 20 minutes, 2-3 times a day.      General instructions  Take over-the-counter and prescription medicines only as told by your health care provider.  Ask your health care provider if the medicine prescribed to you requires you to avoid driving or using heavy machinery.  You may need to take actions to prevent or treat constipation, such as: ? Drink enough fluid to keep your urine pale yellow. ? Take over-the-counter or prescription medicines. ? Eat foods that are high in fiber, such as beans, whole grains, and fresh fruits and vegetables. ? Limit foods that are high in fat and  processed sugars, such as fried or sweet foods.  Keep all follow-up visits as told by your health care provider. This is important. How is this prevented?  Do not sit for longer than 20 minutes at a time. When you sit, choose padded surfaces.  Warm up and stretch before being active.  Cool down and stretch after being active.  Give your body time to rest between periods of activity.  Make sure to use equipment that fits you.  Maintain physical fitness, including: ? Strength. ? Flexibility. Contact a health care provider if:  Your pain and stiffness continue or get worse.  Your leg or hip becomes weak.  You have changes in your bowel function or bladder function. Summary  Piriformis syndrome is a condition that can cause pain, tingling, and numbness in your buttocks and down the back of your leg.  You may try applying heat or ice to relieve the pain.  Do not sit for long periods. Get up and walk around every 20 minutes or as often as told by your health care provider. This information is not intended to replace advice given to you by your health care provider. Make sure you discuss any questions you have with your health care provider. Document Revised: 12/18/2018 Document Reviewed: 04/23/2018 Elsevier Patient Education  2021 [employer].

## 2020-11-10 DIAGNOSIS — G5700 Lesion of sciatic nerve, unspecified lower limb: Secondary | ICD-10-CM | POA: Insufficient documentation

## 2020-11-10 NOTE — Assessment & Plan Note (Signed)
Tenderness on exam and history significant for inflammation of the sciatic nerve. Denies any red flag symptoms or traumatic injury. -Gabapentin -Voltaren gel -Heat or ice -Referred for physical therapy -Return in 2 weeks if not improving

## 2020-11-10 NOTE — Assessment & Plan Note (Signed)
Elevated to 190/92 today but lowered on repeat evaluation by provider to 170/90.  Patient endorses just taking her blood pressure medicine right before the appointment and is in significant pain and was rushing. Patient needs refill of her amlodipine which I sent in today. Recommended patient follow-up with her PCP in 2 weeks to reevaluate Patient is asymptomatic today

## 2020-11-24 ENCOUNTER — Ambulatory Visit: Payer: No Typology Code available for payment source | Admitting: Family Medicine

## 2020-11-24 NOTE — Progress Notes (Deleted)
    SUBJECTIVE:   CHIEF COMPLAINT / HPI: f/u HTN   HTN  Patient noted to be hyertensive two weeks ago during visit for back pain. Her antihypertensive regimen includes amlodipine 10mg  daily. She reports adherence to this regimen.   PERTINENT  PMH / PSH:  HTN  Piriformis syndrome   OBJECTIVE:   There were no vitals taken for this visit.  General: female appearing stated age in no acute distress HEENT: MMM, no oral lesions noted,Neck non-tender without lymphadenopathy, masses or thyromegaly*** Cardio: Normal S1 and S2, no S3 or S4. Rhythm is regular***. No murmurs or rubs.  Bilateral radial pulses palpable Pulm: Clear to auscultation bilaterally, no crackles, wheezing, or diminished breath sounds. Normal respiratory effort, stable on *** Abdomen: Bowel sounds normal. Abdomen soft and non-tender. *** Extremities: No peripheral edema. Warm/ well perfused. *** Neuro: pt alert and oriented x4    ASSESSMENT/PLAN:   No problem-specific Assessment & Plan notes found for this encounter.     , MD [facility]   {    This will disappear when note is signed, click to select method of visit    :1}

## 2021-01-24 ENCOUNTER — Other Ambulatory Visit: Payer: Self-pay | Admitting: Student in an Organized Health Care Education/Training Program

## 2021-03-02 ENCOUNTER — Other Ambulatory Visit: Payer: Self-pay

## 2021-03-02 ENCOUNTER — Ambulatory Visit (INDEPENDENT_AMBULATORY_CARE_PROVIDER_SITE_OTHER): Payer: Self-pay | Admitting: Family Medicine

## 2021-03-02 VITALS — BP 144/67 | HR 101 | Ht 62.0 in | Wt 164.5 lb

## 2021-03-02 DIAGNOSIS — M5416 Radiculopathy, lumbar region: Secondary | ICD-10-CM

## 2021-03-02 MED ORDER — CYCLOBENZAPRINE HCL 10 MG PO TABS: 10.0000 mg | ORAL_TABLET | Freq: Every day | ORAL | 0 refills | Status: DC

## 2021-03-02 MED ORDER — PREDNISONE 10 MG (21) PO TBPK: ORAL_TABLET | ORAL | 0 refills | Status: DC

## 2021-03-02 NOTE — Progress Notes (Signed)
SUBJECTIVE:   CHIEF COMPLAINT / HPI: sciatic nerve pain  55 yo woman presents with 3 months of sciatic nerve pain. She reports that she has had low back pain and muscle spasms a year ago, but this is a different type of pain. The pain is located in her left buttock and she has aching pain and tingling/numbness that radiates down to her knee. She denies saddle anesthesia and incontinence. She was seen at [facility] in March for similar symptoms and tried gabapentin 600 mg qhs which made her groggy, but it did not relieve the pain and did not help her sleep. Her pain is present if she maintains any position for too long, but is most bothersome at night and she struggles to get sleep and find a comfortable position. She has been using ibuprofen [phone] mg q8h, voltaren gel, tylenol occasionally, and home exercises without relief. She had a lumbar MRI in August 2021 that showed left sided facet arthrosis at L4-L5. She has not done PT yet, but is interested in it when her pain is better controlled.  PERTINENT  PMH / PSH: left-sided lumbar radiculopathy  OBJECTIVE:   BP (!) 144/67   Pulse (!) 101   Ht 5\' 2"  (1.575 m)   Wt 164 lb 8 oz (74.6 kg)   SpO2 98%   BMI 30.09 kg/m   Nursing note and vitals reviewed GEN: age-appropriate AAW, resting uncomfortably in chair, NAD, mildly obese, alert and at baseline Lumbar spine: - Inspection: no gross deformity or asymmetry, swelling or ecchymosis. No skin changes - Palpation: No TTP over the spinous processes, paraspinal muscles, or SI joints b/l, TTP in mid left glute - ROM: limited active ROM of the lumbar spine in flexion and extension with pain - Strength: 5/5 strength of lower extremity in L4-S1 nerve root distributions b/l - Neuro: sensation intact in the L4-S1 nerve root distribution b/l, 2+ L4 and S1 reflexes - Straight Leg Raise test: positive bilaterally Ext: no edema Psych: Pleasant and appropriate   ASSESSMENT/PLAN:   Chronic left-sided  lumbar radiculopathy Patient with left radiculopathy symptoms present since March, but worsening despite conservative management. While pain is present in the piriformis distribution, her symptoms are most consistent with sciatica, and with MRI history I suspect this is radiculopathy from lumbar spine rather than piriformis syndrome. No red flag symptoms present. She has tried OTC NSAIDs, topical voltaren gel, gabapentin, home exercises without improvement. We discussed the nature of this pain and that data behind steroids is limited, but it may be beneficial. Patient wishes to try steroids to see if any improvement, will do 6 day prednisone dose pack. She reports that flexeril helped her in the past with sleeping with back pain; for optimum functioning will trial flexeril qhs. Discussed that she should not use NSAIDs and steroids together, only tylenol while doing the 6 days of steroids, patient able to do teachback appropriately. She can start mobic after the steroid dose pack. Patient has no hx of DM. Ideally she will be more comfortable after this approach and can begin PT once pain is better controlled. Follow up in 2 weeks, return precautions discussed. May try duloxetine if no improvement at that time, may consider updated imaging if no improvement with PT. - prednisone dose pack 10 mg (start 6 pills on day 1 and decrease by one pill daily until complete) - flexeril 10 mg qhs - referral to ambulatory PT placed - mobiv 15 mg daily to start only once steroids are  complete     Shirlean Mylar, MD [facility]

## 2021-03-02 NOTE — Assessment & Plan Note (Addendum)
Patient with left radiculopathy symptoms present since March, but worsening despite conservative management. While pain is present in the piriformis distribution, her symptoms are most consistent with sciatica, and with MRI history I suspect this is radiculopathy from lumbar spine rather than piriformis syndrome. No red flag symptoms present. She has tried OTC NSAIDs, topical voltaren gel, gabapentin, home exercises without improvement. We discussed the nature of this pain and that data behind steroids is limited, but it may be beneficial. Patient wishes to try steroids to see if any improvement, will do 6 day prednisone dose pack. She reports that flexeril helped her in the past with sleeping with back pain; for optimum functioning will trial flexeril qhs. Discussed that she should not use NSAIDs and steroids together, only tylenol while doing the 6 days of steroids, patient able to do teachback appropriately. She can start mobic after the steroid dose pack. Patient has no hx of DM. Ideally she will be more comfortable after this approach and can begin PT once pain is better controlled. Follow up in 2 weeks, return precautions discussed. May try duloxetine if no improvement at that time, may consider updated imaging if no improvement with PT. - prednisone dose pack 10 mg (start 6 pills on day 1 and decrease by one pill daily until complete) - flexeril 10 mg qhs - referral to ambulatory PT placed - mobiv 15 mg daily to start only once steroids are complete

## 2021-03-02 NOTE — Patient Instructions (Signed)
It was a pleasure to see you today!  For your sciatic nerve pain: Please start the steroid dose pack for 6 days: 1st day take 6 pills, 2nd day take 5 pills, 3rd day take 4 pills, 4th day take 3 pills, 5th day take 2 pills, 6th day take 1 pill and STOP. Take flexeril 10 mg at night to help sleep.  Continue heat/ice, stretching, voltaren gel as needed  Start taking mobic 15 mg once daily only AFTER the steroid pack has finished. DO not take this at the same time as the steroid. While on the steroid, take tylenol for pain. Follow up on 03/22/21 at 1:50 PM to check in on your pain. I have placed a referral for physical therapy. You should receive a phone call in 1-2 weeks to schedule this appointment. If for any reason you do not receive a phone call or need more help scheduling this appointment, please call our office at [phone]. You can start the PT when you are feeling better.  Be Well,  Dr. Leary Roca

## 2021-03-22 ENCOUNTER — Ambulatory Visit: Payer: Self-pay | Admitting: Family Medicine

## 2021-05-11 NOTE — Patient Instructions (Addendum)
It was wonderful to see you today.  Please bring ALL of your medications with you to every visit.   Today we talked about:  -I am so sorry I was unable to schedule you an appointment today with Sports medicine. Their number is [phone]. Please call first thing Tuesday morning for an appointment. I have sent an urgent referral. -In the interim, I have sent a prescription for Mobic that you can use once daily.  -If this does not improve, we may need to get more imaging.   Thank you for choosing [facility].   Please call [phone] with any questions about today's appointment.  Please be sure to schedule follow up at the front  desk before you leave today.   Sabino Dick, DO PGY-2 Family Medicine

## 2021-05-11 NOTE — Progress Notes (Signed)
    SUBJECTIVE:   CHIEF COMPLAINT / HPI:   F/u Sciatic Nerve Pain Patient presents today for follow-up on her chronic left-sided lumbar radiculopathy.  She has had a couple visits in our clinic for this.  Patient has tried OTC NSAIDs, topical Voltaren gel, gabapentin, prednisone home exercises, all without improvement.  She was last seen in June and shared decision making done to start a steroid Dosepak.  Was also prescribed Flexeril to use nightly.  Advised to start Mobic after completeting steriods, though she did not do this.  Presents today requesting a cortisone shot given that the pain is unbearable.  It constantly wakes her up at night.  She has trouble with certain movements.  States that when she had imaging done of her back previously she did not have sciatic pain.  She reports having the imaging done slowly due to a work-related injury.   PERTINENT  PMH / PSH:  Past Medical History:  Diagnosis Date   Hypertension     OBJECTIVE:   Ht 5\' 2"  (1.575 m)   Wt 173 lb 3.2 oz (78.6 kg)   BMI 31.68 kg/m   BP: 139/82  General: NAD, able to participate in exam, mild discomfort with exam Respiratory: Breathing comfortably on room air, no respiratory distress Extremities: Point tenderness below left gluteus medius with radiation of pain down posterior left leg.  Full active range of motion with back flexion, extension, sidebending, and rotation bilaterally. Skin: warm and dry, no rashes noted  ASSESSMENT/PLAN:   Sciatica, left side History and physical consistent with left-sided sciatic pain.  Patient has point tenderness between gluteus medius and piriformis muscle, consistent with sciatic nerve.  The pain radiates down her leg and into her foot, also consistent with sciatica.  Unfortunately, patient has trialed numerous conservative treatments including over-the-counter anti-inflammatories, prednisone, gabapentin, topical Voltaren gel, and home exercises.  Appears that the pain is  worsening and not responding to these treatments anymore.  At this point, I feel that a cortisone shot may benefit the patient.  Recommended that patient receive this in sports management clinic work to be done under ultrasound guidance.  I have placed an urgent referral to the sports medicine clinic.  In the meantime, I prescribed a month supply of Mobic that patient can use.  Advised her not to use with any other NSAIDs.  Patient may benefit from repeat imaging if symptoms do not improve.  Once her pain is controlled, she would also benefit from physical therapy.     , DO [facility]

## 2021-05-12 ENCOUNTER — Ambulatory Visit (INDEPENDENT_AMBULATORY_CARE_PROVIDER_SITE_OTHER): Payer: Self-pay | Admitting: Family Medicine

## 2021-05-12 ENCOUNTER — Other Ambulatory Visit: Payer: Self-pay

## 2021-05-12 VITALS — Ht 62.0 in | Wt 173.2 lb

## 2021-05-12 DIAGNOSIS — M5432 Sciatica, left side: Secondary | ICD-10-CM | POA: Insufficient documentation

## 2021-05-12 DIAGNOSIS — M5416 Radiculopathy, lumbar region: Secondary | ICD-10-CM

## 2021-05-12 MED ORDER — MELOXICAM 15 MG PO TABS: 15.0000 mg | ORAL_TABLET | Freq: Every day | ORAL | 0 refills | Status: AC

## 2021-05-12 NOTE — Assessment & Plan Note (Signed)
History and physical consistent with left-sided sciatic pain.  Patient has point tenderness between gluteus medius and piriformis muscle, consistent with sciatic nerve.  The pain radiates down her leg and into her foot, also consistent with sciatica.  Unfortunately, patient has trialed numerous conservative treatments including over-the-counter anti-inflammatories, prednisone, gabapentin, topical Voltaren gel, and home exercises.  Appears that the pain is worsening and not responding to these treatments anymore.  At this point, I feel that a cortisone shot may benefit the patient.  Recommended that patient receive this in sports management clinic work to be done under ultrasound guidance.  I have placed an urgent referral to the sports medicine clinic.  In the meantime, I prescribed a month supply of Mobic that patient can use.  Advised her not to use with any other NSAIDs.  Patient may benefit from repeat imaging if symptoms do not improve.  Once her pain is controlled, she would also benefit from physical therapy.

## 2021-05-17 ENCOUNTER — Other Ambulatory Visit: Payer: Self-pay

## 2021-05-17 ENCOUNTER — Ambulatory Visit (INDEPENDENT_AMBULATORY_CARE_PROVIDER_SITE_OTHER): Payer: Self-pay | Admitting: Family Medicine

## 2021-05-17 VITALS — Ht 62.0 in | Wt 170.0 lb

## 2021-05-17 DIAGNOSIS — M5416 Radiculopathy, lumbar region: Secondary | ICD-10-CM

## 2021-05-17 IMAGING — MR MR LUMBAR SPINE W/O CM
5 of 6 series · 37 of 48 positions shown · non-contrast
Comparison: Lumbar spine radiographs 03/07/2020

CLINICAL DATA: Chronic low back pain radiating to the left hip.

EXAM:
MRI LUMBAR SPINE WITHOUT CONTRAST
TECHNIQUE: Multiplanar, multisequence MR imaging of the lumbar spine was
performed. No intravenous contrast was administered.

[Series 5: T1 · sagittal · 4.0mm · 0.62mm/px · 5 of 15 slices shown (1 of 2)]
[im 1/15]
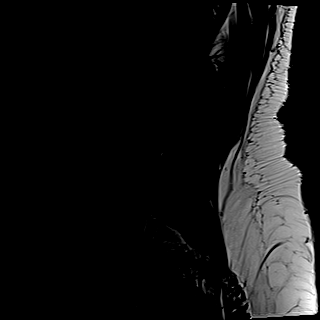
[im 4/15]
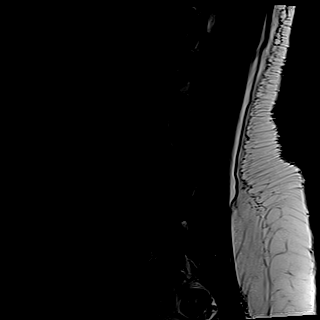
[im 8/15]
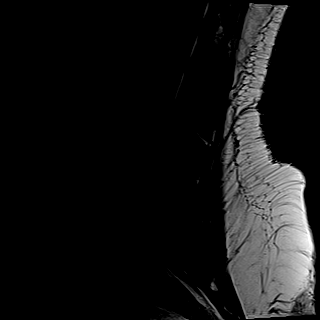
[im 11/15]
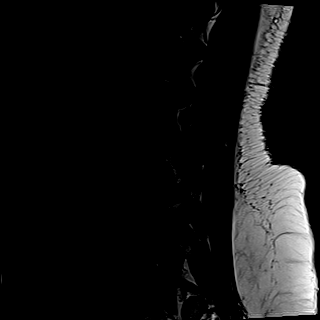
[im 15/15]
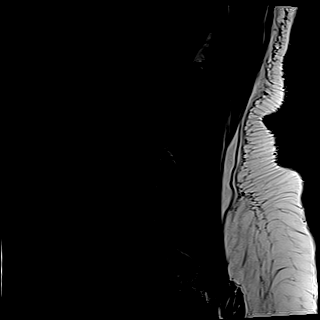

[Series 6: T2 · sagittal · 4.0mm · 0.62mm/px · 5 of 15 slices shown (1 of 3)]
[im 1/15]
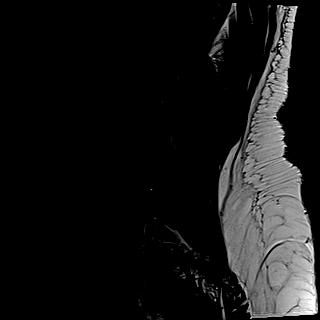
[im 4/15]
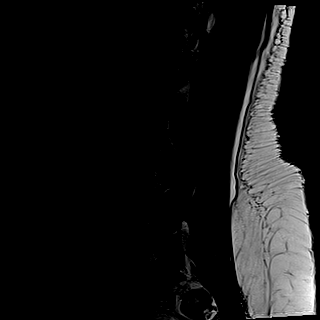
[im 8/15]
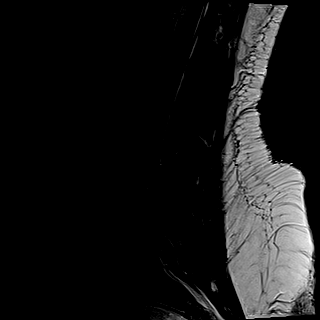
[im 11/15]
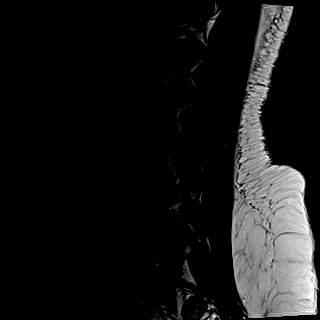
[im 15/15]
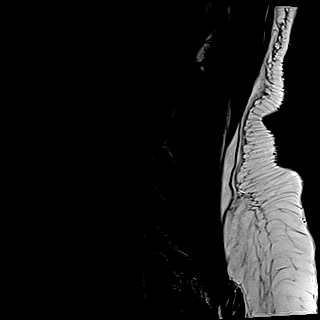

[Series 8: T2 · axial · 4.0mm · 0.62mm/px · z∈[-81,+87]mm · 11 of 31 slices shown (2 of 3)]
[im 1/31]
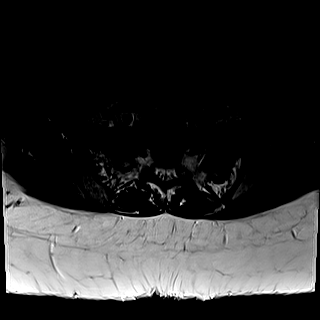
[im 4/31]
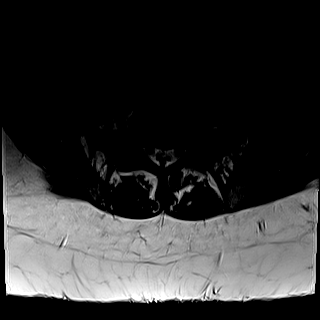
[im 7/31]
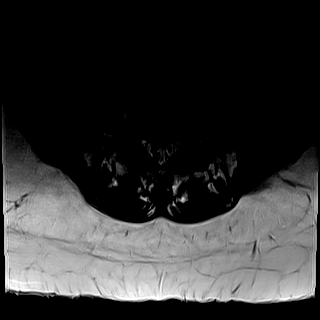
[im 10/31]
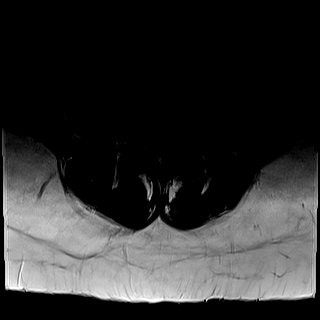
[im 13/31]
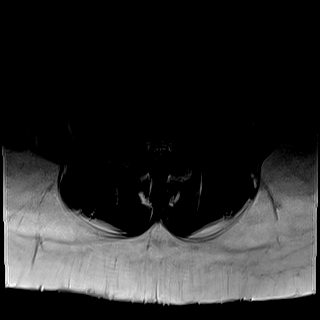
[im 16/31]
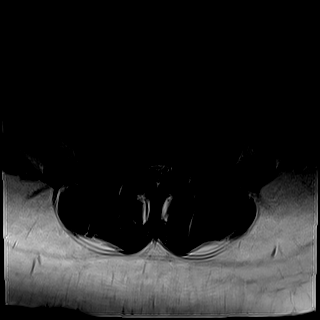
[im 19/31]
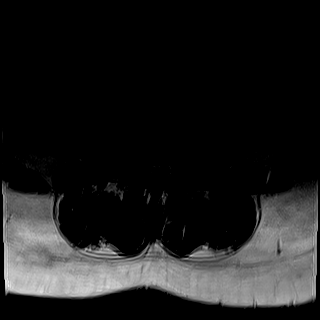
[im 22/31]
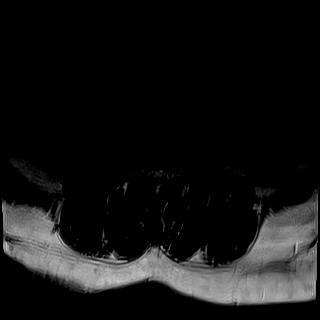
[im 25/31]
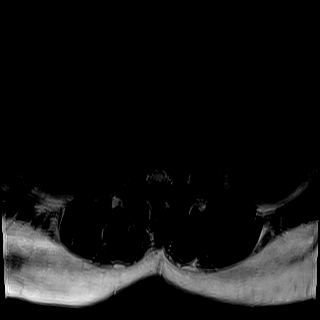
[im 28/31]
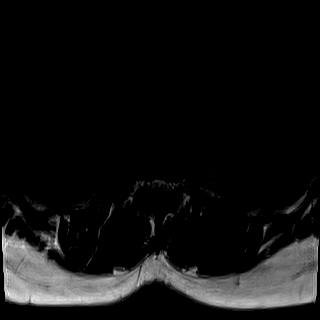
[im 31/31]
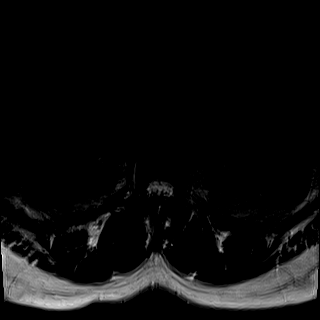

[Series 9: T1 · axial · 4.0mm · 0.39mm/px · z∈[-81,+27]mm · 5 of 31 slices shown (2 of 2)]
[im 1/31]
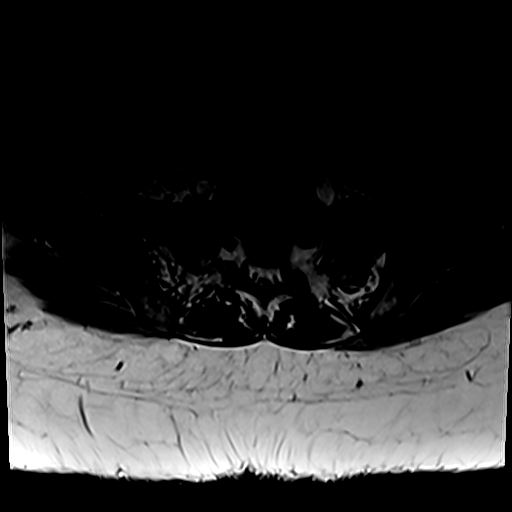
[im 7/31]
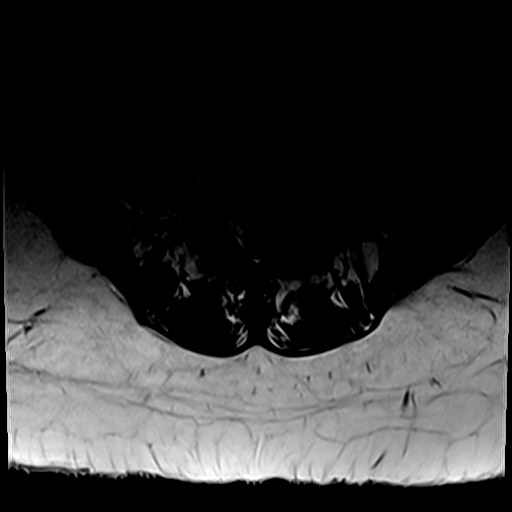
[im 10/31]
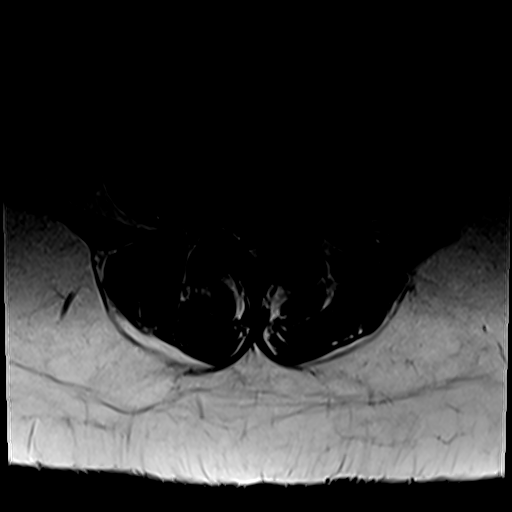
[im 13/31]
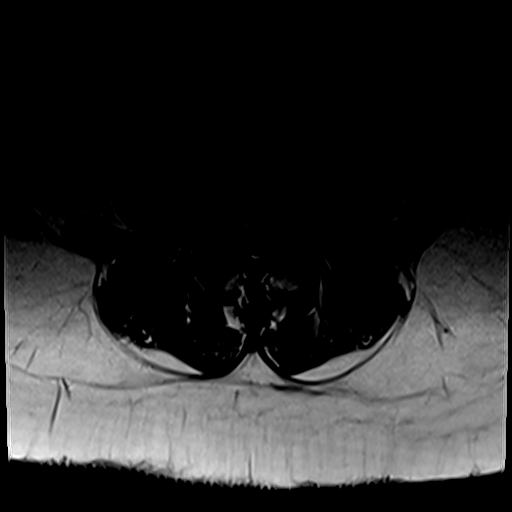
[im 19/31]
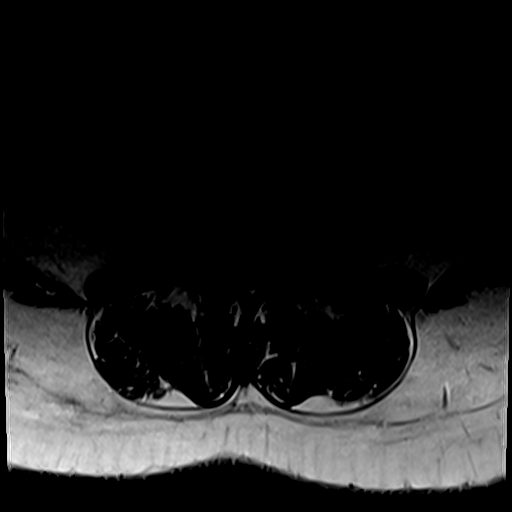

[Series 10: T2 · axial · 4.0mm · 0.62mm/px · z∈[-81,+87]mm · 11 of 31 slices shown (3 of 3)]
[im 1/31]
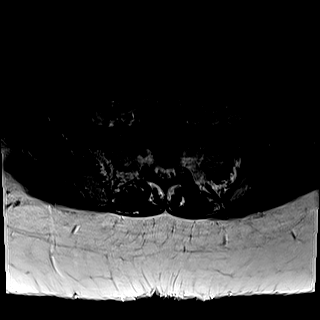
[im 4/31]
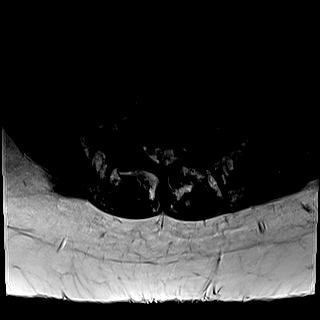
[im 7/31]
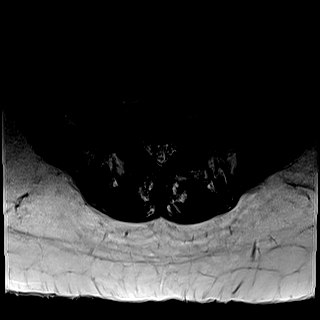
[im 10/31]
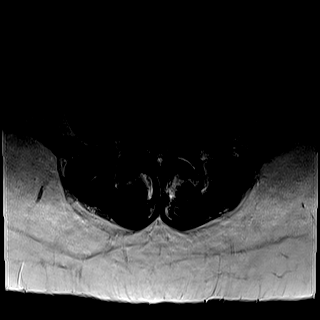
[im 13/31]
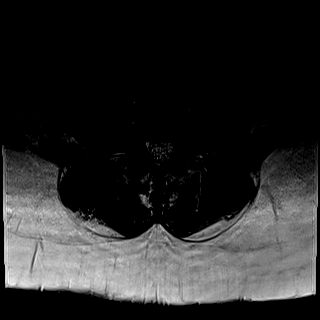
[im 16/31]
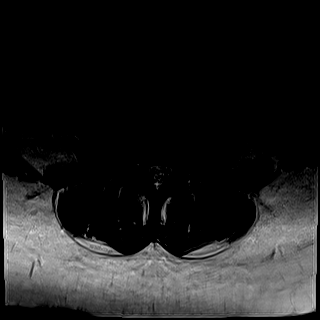
[im 19/31]
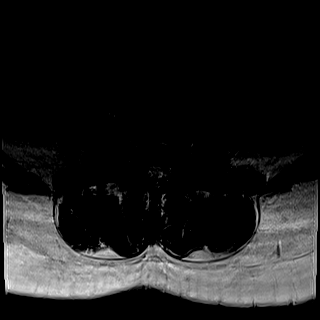
[im 22/31]
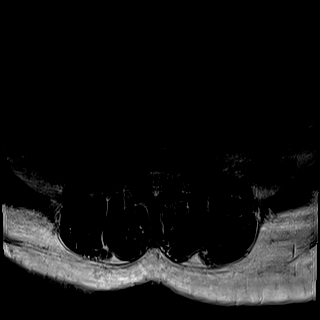
[im 25/31]
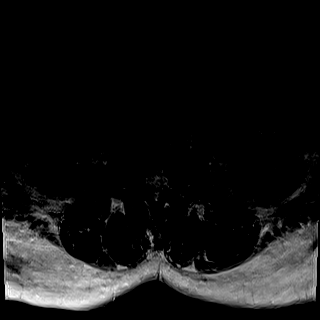
[im 28/31]
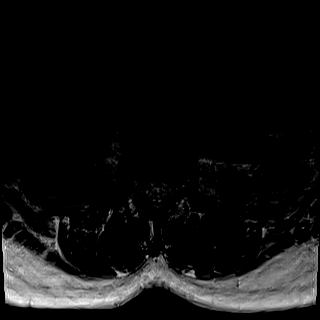
[im 31/31]
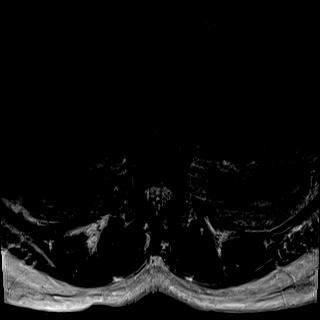

[37 of 48 positions shown; findings below may reference images not displayed]

FINDINGS: The study is motion degraded including severe motion on the axial T2
sequence despite repeat imaging.

Segmentation: Standard.

Alignment:  Normal.

Vertebrae: No fracture or suspicious marrow lesion. Edema about the
left L4-5 facet joint extending into the pedicles.

Conus medullaris and cauda equina: Conus extends to the L1 level.
Conus and cauda equina appear normal.

Paraspinal and other soft tissues: Unremarkable.

Disc levels:

L1-2 through L3-4: Negative.

L4-5: Disc desiccation and mild disc space narrowing. Disc bulging,
ligamentum flavum hypertrophy, and moderate to severe facet
arthrosis result in mild spinal stenosis and mild bilateral neural
foraminal stenosis. Left facet joint effusion.

L5-S1: Mild facet arthrosis without disc herniation or stenosis.
IMPRESSION: 1. Motion degraded examination.
2. Advanced L4-5 facet arthrosis with left-sided edema and mild
spinal and neural foraminal stenosis.

## 2021-05-17 MED ORDER — NAPROXEN 500 MG PO TABS: 500.0000 mg | ORAL_TABLET | Freq: Two times a day (BID) | ORAL | 1 refills | Status: AC | PRN

## 2021-05-17 NOTE — Progress Notes (Signed)
PCP: Ronnald Ramp, MD  Subjective:   HPI: Patient is a 55 y.o. female here for chronic left lumbar back and left leg pain.  Seen today for evaluation of chronic left lower back and leg pain.  She reports onset of pain in late January/early February.  Denies inciting event or trauma that time. Since the onset of pain she has tried numerous medications (including prednisone dose pack, gabapentin) and topical agents for pain relief, none of which have provided sustained relief.  She has also tried home physical therapy exercises.  She states that some of the stretches provided temporary relief, however this was not sustained. She is currently experiencing pain in her left buttock that radiates down the back of her left leg and her left foot.  Pain is made worse with trunk rotation to the left and with movement in general. She has had difficulty sleeping at night due to discomfort.  Ultimately, she has to sleep with numerous pillows between her legs and with her left hip in flexion. 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  The reason for this MRI was left lumbar pain she experienced after lifting a heavy object at work.  The pain she is experiencing today is different than pain she experienced at that time. Denies bowel/bladder dysfunction, weakness in her left leg, and pain or numbness/tingling in her right leg.  Past Medical History:  Diagnosis Date   Hypertension     Current Outpatient Medications on File Prior to Visit  Medication Sig Dispense Refill   amLODipine (NORVASC) 10 MG tablet TAKE 1 TABLET BY MOUTH AT BEDTIME 30 tablet 0   meloxicam (MOBIC) 15 MG tablet Take 1 tablet (15 mg total) by mouth daily. 30 tablet 0   No current facility-administered medications on file prior to visit.    No past surgical history on file.  Allergies   Allergen Reactions   Diclofenac     Pt stated, "This gave me leg cramps"    Ht 5\' 2"  (1.575 m)   Wt 170 lb (77.1 kg)   BMI 31.09 kg/m   No flowsheet data found.  No flowsheet data found.      Objective:  Physical Exam:  Gen: NAD, comfortable in exam room  Lumbar Spine No gross deformity, scoliosis. TTP over the left buttocks radiating into the posterior left leg.  No midline or bony TTP. FROM though she experiences some pain with trunk rotation to the left Strength LEs 5/5 all muscle groups.   2+ MSRs in patellar and achilles tendons, equal bilaterally. Negative SLRs. Sensation intact to light touch bilaterally.   Left Hip No deformity. FROM with 5/5 strength. No tenderness to palpation. NVI distally. Negative logroll Negative faber, fadir, and Obers   Assessment & Plan:  1.  Left lower back and leg pain 2.  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  Particularly in the setting of her prior MRI from 04/12/2020, her symptoms can likely be explained by advanced L4-5 facet arthropathy with foraminal stenosis.  Treatment options discussed. We will refer her to IR for L4-5 facet injection.  A referral to physical therapy has also been  placed.  Since she has not experienced any pain relief while taking Mobic, we will stop this today and switch to naproxen.  She has been instructed to call our office a week after her injection to let us know how she is doing.  We can arrange appropriate follow-up at that time.  Christel Mormon, MD PGY-3  Patient seen and examined with resident.  Agree with his note and findings.

## 2021-05-17 NOTE — Patient Instructions (Addendum)
Your history and exam are consistent with referred pain from a lumbar nerve root being irritated. We will set you up to have an injection for this (facet joint at L4-5) A week after the shot start physical therapy. Also a week after the shot message or call me to let me know how you're doing as the shot should have helped you for sure by this point. You had cramps with diclofenac so we can't try that unfortunately. Stop the mobic and don't take ibuprofen. Last one to try would be naproxen 500mg  twice a day with food - I sent this in for you.

## 2021-05-18 ENCOUNTER — Other Ambulatory Visit: Payer: Self-pay | Admitting: Family Medicine

## 2021-05-18 DIAGNOSIS — M5416 Radiculopathy, lumbar region: Secondary | ICD-10-CM

## 2021-06-02 ENCOUNTER — Other Ambulatory Visit: Payer: Self-pay

## 2021-06-16 ENCOUNTER — Inpatient Hospital Stay
Admission: RE | Admit: 2021-06-16 | Discharge: 2021-06-16 | Disposition: A | Discharge status: Home / Self Care | Payer: Self-pay | Source: Ambulatory Visit | Attending: Family Medicine | Admitting: Family Medicine

## 2021-06-16 NOTE — Discharge Instructions (Signed)

## 2022-02-13 ENCOUNTER — Encounter: Payer: Self-pay | Admitting: *Deleted

## 2024-02-18 ENCOUNTER — Encounter: Payer: Self-pay | Admitting: *Deleted
